# Patient Record
Sex: Male | Born: 1960 | Race: Black or African American | Hispanic: No | Marital: Single | Smoking: Former smoker
Health system: Southern US, Community
[De-identification: ages and names within clinical notes are randomized; demographics above are authoritative.]

## PROBLEM LIST (undated history)

## (undated) DIAGNOSIS — I10 Essential (primary) hypertension: Secondary | ICD-10-CM

## (undated) HISTORY — PX: KIDNEY STONE SURGERY: SHX686

## (undated) HISTORY — DX: Essential (primary) hypertension: I10

---

## 2005-05-14 ENCOUNTER — Emergency Department: Payer: Self-pay | Admitting: Emergency Medicine

## 2005-10-01 ENCOUNTER — Emergency Department: Payer: Self-pay | Admitting: Emergency Medicine

## 2005-10-06 ENCOUNTER — Emergency Department: Payer: Self-pay | Admitting: Emergency Medicine

## 2005-11-06 ENCOUNTER — Ambulatory Visit: Payer: Self-pay | Admitting: Urology

## 2005-11-08 ENCOUNTER — Emergency Department: Payer: Self-pay | Admitting: Emergency Medicine

## 2005-11-16 ENCOUNTER — Ambulatory Visit: Payer: Self-pay | Admitting: Urology

## 2005-11-20 ENCOUNTER — Emergency Department: Payer: Self-pay | Admitting: Emergency Medicine

## 2007-04-07 ENCOUNTER — Emergency Department (HOSPITAL_COMMUNITY): Admission: EM | Admit: 2007-04-07 | Discharge: 2007-04-07 | Payer: Self-pay | Admitting: Emergency Medicine

## 2010-04-02 ENCOUNTER — Emergency Department: Payer: Self-pay | Admitting: Emergency Medicine

## 2014-03-25 DIAGNOSIS — I10 Essential (primary) hypertension: Secondary | ICD-10-CM | POA: Insufficient documentation

## 2015-05-14 ENCOUNTER — Ambulatory Visit: Payer: Self-pay | Admitting: Family Medicine

## 2015-06-11 ENCOUNTER — Ambulatory Visit (INDEPENDENT_AMBULATORY_CARE_PROVIDER_SITE_OTHER): Payer: No Typology Code available for payment source | Admitting: Family Medicine

## 2015-06-11 ENCOUNTER — Encounter: Payer: Self-pay | Admitting: Family Medicine

## 2015-06-11 VITALS — BP 180/120 | HR 83 | Temp 98.1°F | Resp 16 | Ht 69.0 in | Wt 183.4 lb

## 2015-06-11 DIAGNOSIS — I10 Essential (primary) hypertension: Secondary | ICD-10-CM

## 2015-06-11 DIAGNOSIS — N200 Calculus of kidney: Secondary | ICD-10-CM

## 2015-06-11 DIAGNOSIS — I119 Hypertensive heart disease without heart failure: Secondary | ICD-10-CM

## 2015-06-11 DIAGNOSIS — Z0000 Encounter for general adult medical examination without abnormal findings: Secondary | ICD-10-CM

## 2015-06-11 DIAGNOSIS — M62838 Other muscle spasm: Secondary | ICD-10-CM | POA: Insufficient documentation

## 2015-06-11 MED ORDER — LISINOPRIL-HYDROCHLOROTHIAZIDE 10-12.5 MG PO TABS: 1.0000 | ORAL_TABLET | Freq: Every day | ORAL | Status: DC

## 2015-06-11 NOTE — Progress Notes (Signed)
Patient ID: Oscar Clark, male   DOB: 03/19/1961, 54 y.o.   MRN: [id number]       Patient: Oscar Clark, Male    DOB: 03/15/1961, 54 y.o.   MRN: [id number] Visit Date: 06/11/2015  Today's Provider: Dortha Kernennis Chrismon, PA   Chief Complaint  Patient presents with  . Establish Care   Subjective:    Annual physical exam Oscar Clark [name] is a 54 y.o. male who presents today for health maintenance and complete physical. He feels fairly well. He reports exercising by walking 20-30 minutes 3 times a week). 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 Smokes 1 pack of cigarettes/week, gets 6 beers on the weekends, 2 soft drinks/day and occasionally an energy drink. Admits he has not taken the Lisinopril/HCTZ 10/12.5 mg qd for the past week. Was started on this medication a year ago by Dr. Juliann Paresallwood.  ----------------------------------------------------------------- Review of Systems  Constitutional: Negative.   HENT: Negative.   Eyes: Negative.   Respiratory: Negative.   Cardiovascular: Negative.   Gastrointestinal: Negative.   Endocrine: Negative.   Genitourinary: Negative.   Musculoskeletal: Positive for myalgias.       Back ache associated with work.  Skin: Negative.   Allergic/Immunologic: Negative.   Neurological: Negative.   Hematological: Negative.   Psychiatric/Behavioral: Negative.    Social History He  reports that he has been smoking.  He has never used smokeless tobacco. He reports that he drinks alcohol. He reports that he does not use illicit drugs. Social History   Social History  . Marital Status: Single    Spouse Name: N/A  . Number of Children: N/A  . Years of Education: N/A   Social History Main Topics  . Smoking status: Current Some Day Smoker  . Smokeless tobacco: Never Used  . Alcohol Use: 0.0 oz/week    0 Standard  drinks or equivalent per week     Comment: OCCASIONALLY  . Drug Use: No  . Sexual Activity: Not Asked   Other Topics Concern  . None   Social History Narrative  . None    Patient Active Problem List   Diagnosis Date Noted  . Kidney stone 06/11/2015    Class: History of  . Muscle spasm 06/11/2015  . BP (high blood pressure) 03/25/2014   Past Surgical History  Procedure Laterality Date  . Kidney stone surgery     Family History  Family Status  Relation Status Death Age  . Mother Alive   . Father Deceased 6460'S  . Brother Alive    No Known Allergies  Current Outpatient Prescriptions  Medication Sig Dispense Refill  . lisinopril-hydrochlorothiazide (PRINZIDE,ZESTORETIC) 10-12.5 MG tablet Take 1 tablet by mouth daily. 30 tablet 3  . amLODipine (NORVASC) 5 MG tablet Take 5 mg by mouth daily.    Marland Kitchen. labetalol (NORMODYNE) 100 MG tablet Take 100 mg by mouth 2 (two) times daily.     No current facility-administered medications for this visit.   Patient Care Team: Tamsen Roersennis E Chrismon, PA as PCP - General (Family Medicine)     Objective:   Vitals: BP 180/120 mmHg  Pulse 83  Temp(Src) 98.1 F (36.7 C) (Oral)  Resp 16  Ht 5\' 9"  (1.753 m)  Wt 183 lb 6.4 oz (83.19 kg)  BMI 27.07 kg/m2  SpO2 97%   Physical Exam  Constitutional:  He is oriented to person, place, and time. He appears well-developed and well-nourished.  HENT:  Head: Normocephalic and atraumatic.  Right Ear: External ear normal.  Left Ear: External ear normal.  Nose: Nose normal.  Mouth/Throat: Oropharynx is clear and moist.  Eyes: EOM are normal. Pupils are equal, round, and reactive to light.  Neck: Normal range of motion. Neck supple. No JVD present. No thyromegaly present.  Cardiovascular: Normal rate, regular rhythm, normal heart sounds and intact distal pulses.   Pulmonary/Chest: Effort normal and breath sounds normal.  Abdominal: Soft. Bowel sounds are normal.  Genitourinary: Rectum normal, prostate  normal and penis normal. Guaiac negative stool.  Musculoskeletal: Normal range of motion. He exhibits no edema.  Lymphadenopathy:    He has no cervical adenopathy.  Neurological: He is alert and oriented to person, place, and time. He has normal reflexes.  Skin: Skin is warm and dry.  Psychiatric: He has a normal mood and affect. His behavior is normal. Thought content normal.   Depression Screen Denies suicidal ideation. No significant anxiety.   Assessment & Plan:     Routine Health Maintenance and Physical Exam  Exercise Activities and Dietary recommendations Goals    Continue present walking regimen.      Discussed health benefits of physical activity, and encouraged him to engage in regular exercise appropriate for his age and condition.    -------------------------------------------------------------------- 1. Annual physical exam General health stable. BP very high today. Essentially asymptomatic today. Has not had a colonoscopy and unsure when he had his last tetanus booster. Hemoccult test negative for occult blood. Consider Tdap and scheduling colonoscopy as soon as BP controlled again. - POC Hemoccult Bld/Stl (1-Cd Office Dx)  2. Essential hypertension Very poor compliance with medications. Pharmacy states these medications have not been refilled since getting first prescription 03-25-14. States he has some of the Amlodipine and Labetalol at home. Will refill Lisinopril-HCTZ and restart these medications to take DAILY. Will get recheck of labs. EKG shows some LVH. Recheck appointment in 2 weeks (sooner pending lab reports). - EKG 12-Lead - CBC with Differential/Platelet - COMPLETE METABOLIC PANEL WITH GFR - Lipid panel - TSH - lisinopril-hydrochlorothiazide (PRINZIDE,ZESTORETIC) 10-12.5 MG tablet; Take 1 tablet by mouth daily.  Dispense: 30 tablet; Refill: 3 - amLODipine (NORVASC) 5 MG tablet; Take 5 mg by mouth daily. - labetalol (NORMODYNE) 100 MG tablet; Take 100 mg  by mouth 2 (two) times daily.  3. Kidney stone No recent pain or hematuria. Continue to drink fluids and limit tea or calcium supplements.  4. LVH (left ventricular hypertrophy) due to hypertensive disease, without heart failure EKG confirms possible left ventricular hypertrophy and some possible anterolateral ischemia. Need to schedule recheck with cardiologist (Dr. Juliann Pares). May be due for follow up of echocardiogram.

## 2015-06-12 LAB — LIPID PANEL
Chol/HDL Ratio: 5.1 ratio units — ABNORMAL HIGH (ref 0.0–5.0)
Cholesterol, Total: 202 mg/dL — ABNORMAL HIGH (ref 100–199)
HDL: 40 mg/dL (ref >39)
LDL Calculated: 121 mg/dL — ABNORMAL HIGH (ref 0–99)
Triglycerides: 206 mg/dL — ABNORMAL HIGH (ref 0–149)
VLDL Cholesterol Cal: 41 mg/dL — ABNORMAL HIGH (ref 5–40)

## 2015-06-12 LAB — CBC WITH DIFFERENTIAL/PLATELET
Basophils Absolute: 0 10*3/uL (ref 0.0–0.2)
Basos: 1 %
EOS (ABSOLUTE): 0.2 10*3/uL (ref 0.0–0.4)
EOS: 3 %
HEMATOCRIT: 45.3 % (ref 37.5–51.0)
Hemoglobin: 14.9 g/dL (ref 12.6–17.7)
Immature Grans (Abs): 0 10*3/uL (ref 0.0–0.1)
Immature Granulocytes: 0 %
LYMPHS ABS: 2.1 10*3/uL (ref 0.7–3.1)
Lymphs: 32 %
MCH: 27.3 pg (ref 26.6–33.0)
MCHC: 32.9 g/dL (ref 31.5–35.7)
MCV: 83 fL (ref 79–97)
MONOS ABS: 0.4 10*3/uL (ref 0.1–0.9)
Monocytes: 7 %
NEUTROS ABS: 3.8 10*3/uL (ref 1.4–7.0)
Neutrophils: 57 %
Platelets: 300 10*3/uL (ref 150–379)
RBC: 5.45 x10E6/uL (ref 4.14–5.80)
RDW: 13.6 % (ref 12.3–15.4)
WBC: 6.5 10*3/uL (ref 3.4–10.8)

## 2015-06-12 LAB — COMPREHENSIVE METABOLIC PANEL
A/G RATIO: 1.7 (ref 1.1–2.5)
ALK PHOS: 81 IU/L (ref 39–117)
ALT: 21 IU/L (ref 0–44)
AST: 21 IU/L (ref 0–40)
Albumin: 4.5 g/dL (ref 3.5–5.5)
BUN/Creatinine Ratio: 19 (ref 9–20)
BUN: 23 mg/dL (ref 6–24)
Bilirubin Total: 0.3 mg/dL (ref 0.0–1.2)
CALCIUM: 10.1 mg/dL (ref 8.7–10.2)
CO2: 22 mmol/L (ref 18–29)
CREATININE: 1.22 mg/dL (ref 0.76–1.27)
Chloride: 102 mmol/L (ref 97–108)
GFR calc Af Amer: 77 mL/min/{1.73_m2} (ref >59)
GFR, EST NON AFRICAN AMERICAN: 66 mL/min/{1.73_m2} (ref >59)
GLOBULIN, TOTAL: 2.7 g/dL (ref 1.5–4.5)
Glucose: 105 mg/dL — ABNORMAL HIGH (ref 65–99)
POTASSIUM: 4.3 mmol/L (ref 3.5–5.2)
SODIUM: 143 mmol/L (ref 134–144)
Total Protein: 7.2 g/dL (ref 6.0–8.5)

## 2015-06-12 LAB — TSH: TSH: 1.15 u[IU]/mL (ref 0.450–4.500)

## 2015-06-14 ENCOUNTER — Telehealth: Payer: Self-pay

## 2015-06-14 NOTE — Telephone Encounter (Signed)
-----   Message from Tamsen Roersennis E Chrismon, [place] sent at 06/14/2015  8:19 AM EDT ----- All blood tests essentially normal except cholesterol and triglycerides high. Need Pravachol 20 mg qd #30 & 3 RF. Be sure to take Lisinopril-HCTZ 10-12.5 mg qd, Amlodipine 5 mg qd, Labetalol 100 mg BID, proceed with cardiology referral and recheck BP in 2 weeks.

## 2015-06-14 NOTE — Telephone Encounter (Signed)
LMTCB

## 2015-06-17 NOTE — Telephone Encounter (Signed)
LMTCB

## 2015-06-22 NOTE — Telephone Encounter (Signed)
Unable to reach patient by phone. Letter mailed to address in chart.

## 2016-08-01 ENCOUNTER — Encounter: Payer: Self-pay | Admitting: Family Medicine

## 2016-08-01 ENCOUNTER — Ambulatory Visit (INDEPENDENT_AMBULATORY_CARE_PROVIDER_SITE_OTHER): Payer: Managed Care, Other (non HMO) | Admitting: Family Medicine

## 2016-08-01 VITALS — BP 174/110 | HR 98 | Temp 98.9°F | Resp 16 | Wt 174.6 lb

## 2016-08-01 DIAGNOSIS — M545 Low back pain, unspecified: Secondary | ICD-10-CM

## 2016-08-01 LAB — POCT URINALYSIS DIPSTICK
Bilirubin, UA: NEGATIVE
Glucose, UA: NEGATIVE
KETONES UA: NEGATIVE
Leukocytes, UA: NEGATIVE
Nitrite, UA: NEGATIVE
PH UA: 5
RBC UA: NEGATIVE
UROBILINOGEN UA: 0.2

## 2016-08-01 MED ORDER — CYCLOBENZAPRINE HCL 5 MG PO TABS: 5.0000 mg | ORAL_TABLET | Freq: Three times a day (TID) | ORAL | 1 refills | Status: DC | PRN

## 2016-08-01 NOTE — Progress Notes (Addendum)
Patient: Oscar Clark Male    DOB: 10/27/1960   55 y.o.   MRN: [id number] Visit Date: 08/01/2016  Today's Provider: Dortha Kernennis Chrismon, PA   Chief Complaint  Patient presents with  . Back Pain   Subjective:    Back Pain  This is a new problem. The current episode started in the past 7 days. The problem occurs constantly. The pain is present in the lumbar spine. The quality of the pain is described as aching. (Possible hematuria ) Risk factors: history of kidney stones. He has tried NSAIDs for the symptoms. The treatment provided no relief.   Past Medical History:  Diagnosis Date  . Hypertension    Patient Active Problem List   Diagnosis Date Noted  . Kidney stone 06/11/2015    Class: History of  . Muscle spasm 06/11/2015  . BP (high blood pressure) 03/25/2014   Past Surgical History:  Procedure Laterality Date  . KIDNEY STONE SURGERY     History reviewed. No pertinent family history.   No Known Allergies    Previous Medications   AMLODIPINE (NORVASC) 5 MG TABLET    Take 5 mg by mouth daily.   LABETALOL (NORMODYNE) 100 MG TABLET    Take 100 mg by mouth 2 (two) times daily.   LISINOPRIL-HYDROCHLOROTHIAZIDE (PRINZIDE,ZESTORETIC) 10-12.5 MG TABLET    Take 1 tablet by mouth daily.    Review of Systems  Constitutional: Negative.   Respiratory: Negative.   Cardiovascular: Negative.   Genitourinary: Positive for hematuria.  Musculoskeletal: Positive for back pain.    Social History  Substance Use Topics  . Smoking status: Current Some Day Smoker  . Smokeless tobacco: Never Used  . Alcohol use 0.0 oz/week     Comment: OCCASIONALLY   Objective:   BP (!) 174/110 (BP Location: Right Arm, Patient Position: Sitting, Cuff Size: Normal)   Pulse 98   Temp 98.9 F (37.2 C) (Oral)   Resp 16   Wt 174 lb 9.6 oz (79.2 kg)   BMI 25.78 kg/m   Physical Exam  Constitutional: He is oriented to person, place, and time. He appears well-developed and well-nourished. No distress.    HENT:  Head: Normocephalic and atraumatic.  Right Ear: Hearing normal.  Left Ear: Hearing normal.  Nose: Nose normal.  Eyes: Conjunctivae and lids are normal. Right eye exhibits no discharge. Left eye exhibits no discharge. No scleral icterus.  Pulmonary/Chest: Effort normal. No respiratory distress.  Musculoskeletal:  Pain in lower back across to sacrum bilaterally. SLR's 90 degrees without radiating pain. Increased pain to bend over. No numbness or weakness.  Neurological: He is alert and oriented to person, place, and time.  Skin: Skin is intact. No lesion and no rash noted.  Psychiatric: He has a normal mood and affect. His speech is normal and behavior is normal. Thought content normal.      Assessment & Plan:     1. Acute left-sided low back pain without sciatica Onset the past 4-5 days. No specific injury identified. Does a lot of repetitive lifting at work of 4-5 lb parts. Recommend moist heat, use Ibuprofen 200 mg 4 tablets TID with food and add Flexeril. Given rehab back exercises and may return to work in 2 days. Recheck if no better in 5 days. May use Aspercreme with Lidocaine rubbed into the lower back. - POCT urinalysis dipstick - cyclobenzaprine (FLEXERIL) 5 MG tablet; Take 1 tablet (5 mg total) by mouth 3 (three) times daily as needed for muscle spasms.  Dispense: 30 tablet; Refill: 1

## 2016-08-01 NOTE — Patient Instructions (Signed)
Back Exercises Introduction If you have pain in your back, do these exercises 2-3 times each day or as told by your doctor. When the pain goes away, do the exercises once each day, but repeat the steps more times for each exercise (do more repetitions). If you do not have pain in your back, do these exercises once each day or as told by your doctor. Exercises Single Knee to Chest  Do these steps 3-5 times in a row for each leg: 1. Lie on your back on a firm bed or the floor with your legs stretched out. 2. Bring one knee to your chest. 3. Hold your knee to your chest by grabbing your knee or thigh. 4. Pull on your knee until you feel a gentle stretch in your lower back. 5. Keep doing the stretch for 10-30 seconds. 6. Slowly let go of your leg and straighten it. Pelvic Tilt  Do these steps 5-10 times in a row: 1. Lie on your back on a firm bed or the floor with your legs stretched out. 2. Bend your knees so they point up to the ceiling. Your feet should be flat on the floor. 3. Tighten your lower belly (abdomen) muscles to press your lower back against the floor. This will make your tailbone point up to the ceiling instead of pointing down to your feet or the floor. 4. Stay in this position for 5-10 seconds while you gently tighten your muscles and breathe evenly. Cat-Cow  Do these steps until your lower back bends more easily: 1. Get on your hands and knees on a firm surface. Keep your hands under your shoulders, and keep your knees under your hips. You may put padding under your knees. 2. Let your head hang down, and make your tailbone point down to the floor so your lower back is round like the back of a cat. 3. Stay in this position for 5 seconds. 4. Slowly lift your head and make your tailbone point up to the ceiling so your back hangs low (sags) like the back of a cow. 5. Stay in this position for 5 seconds. Press-Ups  Do these steps 5-10 times in a row: 1. Lie on your belly  (face-down) on the floor. 2. Place your hands near your head, about shoulder-width apart. 3. While you keep your back relaxed and keep your hips on the floor, slowly straighten your arms to raise the top half of your body and lift your shoulders. Do not use your back muscles. To make yourself more comfortable, you may change where you place your hands. 4. Stay in this position for 5 seconds. 5. Slowly return to lying flat on the floor. Bridges  Do these steps 10 times in a row: 1. Lie on your back on a firm surface. 2. Bend your knees so they point up to the ceiling. Your feet should be flat on the floor. 3. Tighten your butt muscles and lift your butt off of the floor until your waist is almost as high as your knees. If you do not feel the muscles working in your butt and the back of your thighs, slide your feet 1-2 inches farther away from your butt. 4. Stay in this position for 3-5 seconds. 5. Slowly lower your butt to the floor, and let your butt muscles relax. If this exercise is too easy, try doing it with your arms crossed over your chest. Belly Crunches  Do these steps 5-10 times in a row: 1. Lie   on your back on a firm bed or the floor with your legs stretched out. 2. Bend your knees so they point up to the ceiling. Your feet should be flat on the floor. 3. Cross your arms over your chest. 4. Tip your chin a little bit toward your chest but do not bend your neck. 5. Tighten your belly muscles and slowly raise your chest just enough to lift your shoulder blades a tiny bit off of the floor. 6. Slowly lower your chest and your head to the floor. Back Lifts  Do these steps 5-10 times in a row: 1. Lie on your belly (face-down) with your arms at your sides, and rest your forehead on the floor. 2. Tighten the muscles in your legs and your butt. 3. Slowly lift your chest off of the floor while you keep your hips on the floor. Keep the back of your head in line with the curve in your back.  Look at the floor while you do this. 4. Stay in this position for 3-5 seconds. 5. Slowly lower your chest and your face to the floor. Contact a doctor if:  Your back pain gets a lot worse when you do an exercise.  Your back pain does not lessen 2 hours after you exercise. If you have any of these problems, stop doing the exercises. Do not do them again unless your doctor says it is okay. Get help right away if:  You have sudden, very bad back pain. If this happens, stop doing the exercises. Do not do them again unless your doctor says it is okay. This information is not intended to replace advice given to you by your health care provider. Make sure you discuss any questions you have with your health care provider. Document Released: 09/16/2010 Document Revised: 01/20/2016 Document Reviewed: 10/08/2014  2017 Elsevier  

## 2016-12-06 ENCOUNTER — Other Ambulatory Visit: Payer: Self-pay | Admitting: Family Medicine

## 2016-12-06 DIAGNOSIS — I10 Essential (primary) hypertension: Secondary | ICD-10-CM

## 2019-03-16 ENCOUNTER — Observation Stay
Admit: 2019-03-16 | Discharge: 2019-03-16 | Disposition: A | Discharge status: Home / Self Care | Payer: Self-pay | Attending: Family Medicine | Admitting: Family Medicine

## 2019-03-16 ENCOUNTER — Emergency Department: Payer: Self-pay

## 2019-03-16 ENCOUNTER — Observation Stay
Admission: EM | Admit: 2019-03-16 | Discharge: 2019-03-16 | Disposition: A | Discharge status: Home / Self Care | Payer: Self-pay | Attending: Internal Medicine | Admitting: Internal Medicine

## 2019-03-16 ENCOUNTER — Observation Stay: Payer: Self-pay

## 2019-03-16 ENCOUNTER — Other Ambulatory Visit: Payer: Self-pay

## 2019-03-16 DIAGNOSIS — R778 Other specified abnormalities of plasma proteins: Secondary | ICD-10-CM

## 2019-03-16 DIAGNOSIS — Z1159 Encounter for screening for other viral diseases: Secondary | ICD-10-CM | POA: Insufficient documentation

## 2019-03-16 DIAGNOSIS — E876 Hypokalemia: Secondary | ICD-10-CM | POA: Insufficient documentation

## 2019-03-16 DIAGNOSIS — R079 Chest pain, unspecified: Secondary | ICD-10-CM

## 2019-03-16 DIAGNOSIS — I471 Supraventricular tachycardia, unspecified: Secondary | ICD-10-CM | POA: Diagnosis present

## 2019-03-16 DIAGNOSIS — R7989 Other specified abnormal findings of blood chemistry: Secondary | ICD-10-CM | POA: Insufficient documentation

## 2019-03-16 DIAGNOSIS — F17200 Nicotine dependence, unspecified, uncomplicated: Secondary | ICD-10-CM | POA: Insufficient documentation

## 2019-03-16 DIAGNOSIS — Z8249 Family history of ischemic heart disease and other diseases of the circulatory system: Secondary | ICD-10-CM | POA: Insufficient documentation

## 2019-03-16 DIAGNOSIS — I10 Essential (primary) hypertension: Secondary | ICD-10-CM | POA: Insufficient documentation

## 2019-03-16 DIAGNOSIS — Z79899 Other long term (current) drug therapy: Secondary | ICD-10-CM | POA: Insufficient documentation

## 2019-03-16 DIAGNOSIS — R0789 Other chest pain: Secondary | ICD-10-CM | POA: Insufficient documentation

## 2019-03-16 DIAGNOSIS — Z87442 Personal history of urinary calculi: Secondary | ICD-10-CM | POA: Insufficient documentation

## 2019-03-16 LAB — LIPID PANEL
Cholesterol: 166 mg/dL (ref 0–200)
HDL: 43 mg/dL (ref >40)
LDL Cholesterol: 112 mg/dL — ABNORMAL HIGH (ref 0–99)
Total CHOL/HDL Ratio: 3.9 RATIO
Triglycerides: 53 mg/dL (ref <150)
VLDL: 11 mg/dL (ref 0–40)

## 2019-03-16 LAB — CBC WITH DIFFERENTIAL/PLATELET
Abs Immature Granulocytes: 0.01 10*3/uL (ref 0.00–0.07)
Basophils Absolute: 0 10*3/uL (ref 0.0–0.1)
Basophils Relative: 1 %
Eosinophils Absolute: 0.1 10*3/uL (ref 0.0–0.5)
Eosinophils Relative: 2 %
HCT: 48.8 % (ref 39.0–52.0)
Hemoglobin: 15.6 g/dL (ref 13.0–17.0)
Immature Granulocytes: 0 %
Lymphocytes Relative: 23 %
Lymphs Abs: 1.3 10*3/uL (ref 0.7–4.0)
MCH: 27.7 pg (ref 26.0–34.0)
MCHC: 32 g/dL (ref 30.0–36.0)
MCV: 86.7 fL (ref 80.0–100.0)
Monocytes Absolute: 0.3 10*3/uL (ref 0.1–1.0)
Monocytes Relative: 5 %
Neutro Abs: 3.8 10*3/uL (ref 1.7–7.7)
Neutrophils Relative %: 69 %
Platelets: 266 10*3/uL (ref 150–400)
RBC: 5.63 MIL/uL (ref 4.22–5.81)
RDW: 13.6 % (ref 11.5–15.5)
WBC: 5.5 10*3/uL (ref 4.0–10.5)
nRBC: 0 % (ref 0.0–0.2)

## 2019-03-16 LAB — TROPONIN I (HIGH SENSITIVITY)
Troponin I (High Sensitivity): 50 ng/L — ABNORMAL HIGH (ref <18)
Troponin I (High Sensitivity): 360 ng/L (ref <18)

## 2019-03-16 LAB — PROTIME-INR
INR: 1 (ref 0.8–1.2)
Prothrombin Time: 12.7 seconds (ref 11.4–15.2)

## 2019-03-16 LAB — ECHOCARDIOGRAM COMPLETE
Height: 69 in
Weight: 2848 oz

## 2019-03-16 LAB — COMPREHENSIVE METABOLIC PANEL
ALT: 48 U/L — ABNORMAL HIGH (ref 0–44)
AST: 51 U/L — ABNORMAL HIGH (ref 15–41)
Albumin: 4 g/dL (ref 3.5–5.0)
Alkaline Phosphatase: 80 U/L (ref 38–126)
Anion gap: 11 (ref 5–15)
BUN: 25 mg/dL — ABNORMAL HIGH (ref 6–20)
CO2: 17 mmol/L — ABNORMAL LOW (ref 22–32)
Calcium: 8.7 mg/dL — ABNORMAL LOW (ref 8.9–10.3)
Chloride: 109 mmol/L (ref 98–111)
Creatinine, Ser: 1.01 mg/dL (ref 0.61–1.24)
GFR calc Af Amer: >60 mL/min (ref >60)
GFR calc non Af Amer: >60 mL/min (ref >60)
Glucose, Bld: 144 mg/dL — ABNORMAL HIGH (ref 70–99)
Potassium: 3.4 mmol/L — ABNORMAL LOW (ref 3.5–5.1)
Sodium: 137 mmol/L (ref 135–145)
Total Bilirubin: 0.5 mg/dL (ref 0.3–1.2)
Total Protein: 7.2 g/dL (ref 6.5–8.1)

## 2019-03-16 LAB — MAGNESIUM: Magnesium: 2.2 mg/dL (ref 1.7–2.4)

## 2019-03-16 LAB — APTT: aPTT: 31 seconds (ref 24–36)

## 2019-03-16 LAB — SARS CORONAVIRUS 2 BY RT PCR (HOSPITAL ORDER, PERFORMED IN ~~LOC~~ HOSPITAL LAB): SARS Coronavirus 2: NEGATIVE

## 2019-03-16 MED ORDER — ASPIRIN 81 MG PO TBEC: 81.0000 mg | DELAYED_RELEASE_TABLET | Freq: Every day | ORAL | 0 refills | Status: DC

## 2019-03-16 MED ORDER — LABETALOL HCL 100 MG PO TABS
100.0000 mg | ORAL_TABLET | Freq: Two times a day (BID) | ORAL | Status: DC
  Administered 2019-03-16: 100 mg via ORAL
  Filled 2019-03-16 (×2): qty 1

## 2019-03-16 MED ORDER — METOPROLOL TARTRATE 25 MG PO TABS: 12.5000 mg | ORAL_TABLET | Freq: Two times a day (BID) | ORAL | Status: DC

## 2019-03-16 MED ORDER — ASPIRIN EC 81 MG PO TBEC
81.0000 mg | DELAYED_RELEASE_TABLET | Freq: Every day | ORAL | Status: DC
  Administered 2019-03-16: 81 mg via ORAL
  Filled 2019-03-16: qty 1

## 2019-03-16 MED ORDER — LISINOPRIL-HYDROCHLOROTHIAZIDE 10-12.5 MG PO TABS: 1.0000 | ORAL_TABLET | Freq: Every day | ORAL | Status: DC

## 2019-03-16 MED ORDER — POTASSIUM CHLORIDE 20 MEQ PO PACK
40.0000 meq | PACK | Freq: Once | ORAL | Status: AC
  Administered 2019-03-16: 40 meq via ORAL
  Filled 2019-03-16: qty 2

## 2019-03-16 MED ORDER — LABETALOL HCL 100 MG PO TABS
100.0000 mg | ORAL_TABLET | Freq: Two times a day (BID) | ORAL | Status: DC
  Filled 2019-03-16 (×2): qty 1

## 2019-03-16 MED ORDER — LIDOCAINE VISCOUS HCL 2 % MT SOLN
15.0000 mL | Freq: Once | OROMUCOSAL | Status: AC
  Administered 2019-03-16: 15 mL via ORAL
  Filled 2019-03-16: qty 15

## 2019-03-16 MED ORDER — HEPARIN SODIUM (PORCINE) 5000 UNIT/ML IJ SOLN
4000.0000 [IU] | Freq: Once | INTRAMUSCULAR | Status: AC
  Administered 2019-03-16: 4000 [IU] via INTRAVENOUS

## 2019-03-16 MED ORDER — AMLODIPINE BESYLATE 5 MG PO TABS
5.0000 mg | ORAL_TABLET | Freq: Every day | ORAL | Status: DC
  Administered 2019-03-16: 5 mg via ORAL
  Filled 2019-03-16: qty 1

## 2019-03-16 MED ORDER — LABETALOL HCL 200 MG PO TABS
200.0000 mg | ORAL_TABLET | Freq: Two times a day (BID) | ORAL | Status: DC
  Filled 2019-03-16: qty 1

## 2019-03-16 MED ORDER — IOPAMIDOL (ISOVUE-370) INJECTION 76%
100.0000 mL | Freq: Once | INTRAVENOUS | Status: AC | PRN
  Administered 2019-03-16: 100 mL via INTRAVENOUS

## 2019-03-16 MED ORDER — ACETAMINOPHEN 325 MG PO TABS: 650.0000 mg | ORAL_TABLET | ORAL | Status: DC | PRN

## 2019-03-16 MED ORDER — ZOLPIDEM TARTRATE 5 MG PO TABS: 5.0000 mg | ORAL_TABLET | Freq: Every evening | ORAL | Status: DC | PRN

## 2019-03-16 MED ORDER — LISINOPRIL 10 MG PO TABS
10.0000 mg | ORAL_TABLET | Freq: Every day | ORAL | Status: DC
  Administered 2019-03-16: 10 mg via ORAL
  Filled 2019-03-16: qty 1

## 2019-03-16 MED ORDER — HEPARIN (PORCINE) 25000 UT/250ML-% IV SOLN
1150.0000 [IU]/h | INTRAVENOUS | Status: DC
  Administered 2019-03-16 (×2): 1150 [IU]/h via INTRAVENOUS
  Filled 2019-03-16: qty 250

## 2019-03-16 MED ORDER — HYDROCHLOROTHIAZIDE 12.5 MG PO CAPS
12.5000 mg | ORAL_CAPSULE | Freq: Every day | ORAL | Status: DC
  Administered 2019-03-16: 12.5 mg via ORAL
  Filled 2019-03-16: qty 1

## 2019-03-16 MED ORDER — ALUM & MAG HYDROXIDE-SIMETH 200-200-20 MG/5ML PO SUSP
30.0000 mL | Freq: Once | ORAL | Status: AC
  Administered 2019-03-16: 30 mL via ORAL
  Filled 2019-03-16: qty 30

## 2019-03-16 MED ORDER — METOPROLOL TARTRATE 25 MG PO TABS
12.5000 mg | ORAL_TABLET | Freq: Once | ORAL | Status: AC
  Administered 2019-03-16: 12.5 mg via ORAL
  Filled 2019-03-16: qty 1

## 2019-03-16 MED ORDER — METOPROLOL TARTRATE 25 MG PO TABS: 25.0000 mg | ORAL_TABLET | Freq: Two times a day (BID) | ORAL | Status: DC

## 2019-03-16 MED ORDER — CYCLOBENZAPRINE HCL 10 MG PO TABS: 5.0000 mg | ORAL_TABLET | Freq: Three times a day (TID) | ORAL | Status: DC | PRN

## 2019-03-16 MED ORDER — ONDANSETRON HCL 4 MG/2ML IJ SOLN: 4.0000 mg | Freq: Four times a day (QID) | INTRAMUSCULAR | Status: DC | PRN

## 2019-03-16 NOTE — H&P (Addendum)
[facility] [place] [facility]   PATIENT NAME: Oscar Clark    MR#:  [id number]  DATE OF BIRTH:  10/28/1960  DATE OF ADMISSION:  03/16/2019  PRIMARY CARE PHYSICIAN: Chrismon, Jodell Ciproennis E, PA   REQUESTING/REFERRING PHYSICIAN: Chiquita LothSung, Jade, MD CHIEF COMPLAINT:   Chief Complaint  Patient presents with   Chest Pain    HISTORY OF PRESENT ILLNESS:  Oscar HoraBarry Urbanik  is a 58 y.o. male with a known history of hypertension and nephrolithiasis, who presented to the emergency room with a consider right-sided chest pain with associated palpitations that woke him up from sleep.  He was noted to be in SVT with a rate of 190 by EMS.  He was given 6 mg of IV adenosine followed by 12 mg and converted to sinus rhythm.  He admitted to having nausea without vomiting or diaphoresis with his chest pain.  He also experienced brief right hand weakness without numbness with his chest pain.  He denied any cough or wheezing or hemoptysis.  No leg pain or edema or recent travels or surgeries.  No paresthesias or focal muscle weakness or headache or dizziness or blurred vision.  No bleeding diathesis.  No recent sick exposures  Upon presentation to the emergency room, blood pressure was 156/95 with a heart rate of 100 and otherwise normal vital signs.  Portable chest ray showed mild interstitial coarsening probably involving lower lung field that may be chronic with atypical infection not excluded.  COVID-19 test came back negative.  EKG showed normal sinus rhythm with rate of 88 with LVH by voltage criteria.  The patient's high-sensitivity troponin I initial level was 50 and 2 hours later was up to 360.  He was given IV heparin bolus and drip.  He will be admitted to an observation telemetry bed for further evaluation and management. PAST MEDICAL HISTORY:   Past Medical History:  Diagnosis Date   Hypertension   Kidney stones Tobacco abuse PAST SURGICAL HISTORY:   Past Surgical History:    Procedure Laterality Date   KIDNEY STONE SURGERY      SOCIAL HISTORY:   Social History   Tobacco Use   Smoking status: Current Some Day Smoker   Smokeless tobacco: Never Used  Substance Use Topics   Alcohol use: Yes    Alcohol/week: 0.0 standard drinks    Comment: OCCASIONALLY    FAMILY HISTORY:  Positive for hypertension in his mother  DRUG ALLERGIES:  No Known Allergies  REVIEW OF SYSTEMS:   ROS As per history of present illness. All pertinent systems were reviewed above. Constitutional,  HEENT, cardiovascular, respiratory, GI, GU, musculoskeletal, neuro, psychiatric, endocrine,  integumentary and hematologic systems were reviewed and are otherwise  negative/unremarkable except for positive findings mentioned above in the HPI.   MEDICATIONS AT HOME:   Prior to Admission medications   Medication Sig Start Date End Date Taking? Authorizing Provider  amLODipine (NORVASC) 5 MG tablet Take 5 mg by mouth daily.    [provider]  cyclobenzaprine (FLEXERIL) 5 MG tablet Take 1 tablet (5 mg total) by mouth 3 (three) times daily as needed for muscle spasms. 08/01/16   Chrismon, Jodell Ciproennis E, PA  labetalol (NORMODYNE) 100 MG tablet Take 100 mg by mouth 2 (two) times daily.    [provider]  lisinopril-hydrochlorothiazide (PRINZIDE,ZESTORETIC) 10-12.5 MG tablet TAKE ONE TABLET BY MOUTH ONCE DAILY 12/07/16   Chrismon, Jodell Ciproennis E, PA      VITAL SIGNS:  Blood pressure (!) 156/95,  pulse 100, temperature 98 F (36.7 C), temperature source Oral, resp. rate 18, height 5\' 9"  (1.753 m), weight 80.7 kg, SpO2 98 %.  PHYSICAL EXAMINATION:  Physical Exam  GENERAL:  58 y.o.-year-old Caucasian male patient lying in the bed with no acute distress.  EYES: Pupils equal, round, reactive to light and accommodation. No scleral icterus. Extraocular muscles intact.  HEENT: Head atraumatic, normocephalic. Oropharynx and nasopharynx clear.  NECK:  Supple, no jugular venous  distention. No thyroid enlargement, no tenderness.  LUNGS: Normal breath sounds bilaterally, no wheezing, rales,rhonchi or crepitation. No use of accessory muscles of respiration.  CARDIOVASCULAR: Regular rate and rhythm, S1, S2 normal. No murmurs, rubs, or gallops.  ABDOMEN: Soft, nondistended, nontender. Bowel sounds present. No organomegaly or mass.  EXTREMITIES: No pedal edema, cyanosis, or clubbing.  NEUROLOGIC: Cranial nerves II through XII are intact. Muscle strength 5/5 in all extremities. Sensation intact. Gait not checked.  PSYCHIATRIC: The patient is alert and oriented x 3.  Normal affect and good eye contact. SKIN: No obvious rash, lesion, or ulcer.   LABORATORY PANEL:   CBC Recent Labs  Lab 03/16/19 0208  WBC 5.5  HGB 15.6  HCT 48.8  PLT 266   ------------------------------------------------------------------------------------------------------------------  Chemistries  Recent Labs  Lab 03/16/19 0208  NA 137  K 3.4*  CL 109  CO2 17*  GLUCOSE 144*  BUN 25*  CREATININE 1.01  CALCIUM 8.7*  AST 51*  ALT 48*  ALKPHOS 80  BILITOT 0.5   ------------------------------------------------------------------------------------------------------------------  Cardiac Enzymes No results for input(s): TROPONINI in the last 168 hours. ------------------------------------------------------------------------------------------------------------------  RADIOLOGY:  Dg Chest Port 1 View  Result Date: 03/16/2019 CLINICAL DATA:  58 year old male with chest pain. EXAM: PORTABLE CHEST 1 VIEW COMPARISON:  Chest radiograph dated 04/02/2010 FINDINGS: There is eventration of the left hemidiaphragm. Bibasilar interstitial coarsening. No focal consolidation, pleural effusion, or pneumothorax. Stable cardiac silhouette. No acute osseous pathology. IMPRESSION: Mild interstitial coarsening primarily involving the lower lung field which may be chronic. Atypical infection is not excluded.  Clinical correlation is recommended. Electronically Signed   By: Anner Crete M.D.   On: 03/16/2019 02:31      IMPRESSION AND PLAN:   1.  Supraventricular tachycardia with palpitations and right-sided chest pain status post chemical cardioversion with IV adenosine.  Patient will be admitted to an observation telemetry bed.  Will follow serial hs troponin I.  Will obtain a cardiology consultation by Dr. Saralyn Pilar who was notified about the patient and a 2D echo in a.m. We will obtain a chest CTA to rule out PE and assess for any aortic dissection.  2.  Elevated HS troponin I.  Cardiology consult and 2D echo will be obtained as mentioned above and will follow more levels.  We will place the patient on aspirin as well as continue IV heparin drip.  3.  Hypokalemia.  Will aggressively replace potassium and check magnesium level.  4.  Hypertension.  We will continue Norvasc, lisinopril/HCT and labetalol.  5.  DVT prophylaxis.  The patient will be on IV heparin drip.  All the records are reviewed and case discussed with ED provider. The plan of care was discussed in details with the patient (and family). I answered all questions. The patient agreed to proceed with the above mentioned plan. Further management will depend upon hospital course.   CODE STATUS: Full code  TOTAL TIME TAKING CARE OF THIS PATIENT: 50 minutes.    Christel Mormon M.D on 03/16/2019 at 5:41 AM  Pager -  [phone]  After 6pm go to [email or web address] - Social research officer, [email or web address] [email or web address]  [facility]  Office  [phone]  CC: Primary care physician; Tamsen Roershrismon, Dennis E, PA   Note: This dictation was prepared with Dragon dictation along with smaller phrase technology. Any transcriptional errors that result from this process are unintentional.

## 2019-03-16 NOTE — ED Triage Notes (Signed)
Patient to ED for an elevated heart rate at home of 195. EMS gave 6 of adenosine with no relief. 12 mg of Adenosine brought heart rate to 85 and patient's chest pain resolved. Alert and oriented without pain or shortness of breath.

## 2019-03-16 NOTE — Consult Note (Signed)
[facility]  CARDIOLOGY CONSULT NOTE  Patient ID: Oscar Clark MRN: [id number] DOB/AGE: 59/12/1960 59 y.o.  Admit date: 03/16/2019 Referring Physician Allena KatzPatel Primary Physician Chrismon Primary Cardiologist  Reason for Consultation SVT  HPI: 58 year old gentleman referred for evaluation of SVT.  Patient [facility] emergency room with heart racing.  He was noted be in supraventricular tachycardia and treated with 6 and 12 mg IV adenosine with conversion to sinus rhythm.  CT revealed sinus rhythm at a rate of 88 bpm with left ventricular lipoatrophy.  Labs were notable for borderline elevated high-sensitivity troponin of 50 followed by 360.  The patient is very active, works 3 jobs, denies exertional chest pain.  Review of systems complete and found to be negative unless listed above     Past Medical History:  Diagnosis Date  . Hypertension     Past Surgical History:  Procedure Laterality Date  . KIDNEY STONE SURGERY      Medications Prior to Admission  Medication Sig Dispense Refill Last Dose  . amLODipine (NORVASC) 5 MG tablet Take 5 mg by mouth daily.     . cyclobenzaprine (FLEXERIL) 5 MG tablet Take 1 tablet (5 mg total) by mouth 3 (three) times daily as needed for muscle spasms. 30 tablet 1   . labetalol (NORMODYNE) 100 MG tablet Take 100 mg by mouth 2 (two) times daily.     Marland Kitchen. lisinopril-hydrochlorothiazide (PRINZIDE,ZESTORETIC) 10-12.5 MG tablet TAKE ONE TABLET BY MOUTH ONCE DAILY 30 tablet 3    Social History   Socioeconomic History  . Marital status: Single    Spouse name: Not on file  . Number of children: Not on file  . Years of education: Not on file  . Highest education level: Not on file  Occupational History  . Not on file  Social Needs  . Financial resource strain: Not on file  . Food insecurity    Worry: Not on file    Inability: Not on file  . Transportation needs    Medical: Not on file    Non-medical: Not on file  Tobacco Use  . Smoking  status: Current Some Day Smoker  . Smokeless tobacco: Never Used  Substance and Sexual Activity  . Alcohol use: Yes    Alcohol/week: 0.0 standard drinks    Comment: OCCASIONALLY  . Drug use: No  . Sexual activity: Not on file  Lifestyle  . Physical activity    Days per week: Not on file    Minutes per session: Not on file  . Stress: Not on file  Relationships  . Social Musicianconnections    Talks on phone: Not on file    Gets together: Not on file    Attends religious service: Not on file    Active member of club or organization: Not on file    Attends meetings of clubs or organizations: Not on file    Relationship status: Not on file  . Intimate partner violence    Fear of current or ex partner: Not on file    Emotionally abused: Not on file    Physically abused: Not on file    Forced sexual activity: Not on file  Other Topics Concern  . Not on file  Social History Narrative  . Not on file    No family history on file.    Review of systems complete and found to be negative unless listed above      PHYSICAL EXAM  General: Well developed, well nourished, in no  acute distress HEENT:  Normocephalic and atramatic Neck:  No JVD.  Lungs: Clear bilaterally to auscultation and percussion. Heart: HRRR . Normal S1 and S2 without gallops or murmurs.  Abdomen: Bowel sounds are positive, abdomen soft and non-tender  Msk:  Back normal, normal gait. Normal strength and tone for age. Extremities: No clubbing, cyanosis or edema.   Neuro: Alert and oriented X 3. Psych:  Good affect, responds appropriately  Labs:   Lab Results  Component Value Date   WBC 5.5 03/16/2019   HGB 15.6 03/16/2019   HCT 48.8 03/16/2019   MCV 86.7 03/16/2019   PLT 266 03/16/2019    Recent Labs  Lab 03/16/19 0208  NA 137  K 3.4*  CL 109  CO2 17*  BUN 25*  CREATININE 1.01  CALCIUM 8.7*  PROT 7.2  BILITOT 0.5  ALKPHOS 80  ALT 48*  AST 51*  GLUCOSE 144*   No results found for: CKTOTAL, CKMB,  CKMBINDEX, TROPONINI  Lab Results  Component Value Date   CHOL 166 03/16/2019   CHOL 202 (H) 06/11/2015   Lab Results  Component Value Date   HDL 43 03/16/2019   HDL 40 06/11/2015   Lab Results  Component Value Date   LDLCALC 112 (H) 03/16/2019   LDLCALC 121 (H) 06/11/2015   Lab Results  Component Value Date   TRIG 53 03/16/2019   TRIG 206 (H) 06/11/2015   Lab Results  Component Value Date   CHOLHDL 3.9 03/16/2019   CHOLHDL 5.1 (H) 06/11/2015   No results found for: LDLDIRECT    Radiology: Dg Chest Port 1 View  Result Date: 03/16/2019 CLINICAL DATA:  58 year old male with chest pain. EXAM: PORTABLE CHEST 1 VIEW COMPARISON:  Chest radiograph dated 04/02/2010 FINDINGS: There is eventration of the left hemidiaphragm. Bibasilar interstitial coarsening. No focal consolidation, pleural effusion, or pneumothorax. Stable cardiac silhouette. No acute osseous pathology. IMPRESSION: Mild interstitial coarsening primarily involving the lower lung field which may be chronic. Atypical infection is not excluded. Clinical correlation is recommended. Electronically Signed   By: Anner Crete M.D.   On: 03/16/2019 02:31   Ct Angio Chest/abd/pel For Dissection W And/or W/wo  Result Date: 03/16/2019 CLINICAL DATA:  Severe chest pain and back pain. Concern for dissection and pulmonary embolism.w EXAM: CT ANGIOGRAPHY CHEST, ABDOMEN AND PELVIS TECHNIQUE: Multidetector CT imaging through the chest, abdomen and pelvis was performed using the standard protocol during bolus administration of intravenous contrast. Multiplanar reconstructed images and MIPs were obtained and reviewed to evaluate the vascular anatomy. CONTRAST:  161mL ISOVUE-370 IOPAMIDOL (ISOVUE-370) INJECTION 76% COMPARISON:  None. FINDINGS: CTA CHEST FINDINGS Cardiovascular:  No thoracic aortic aneurysm or aortic dissection. Some of the peripheral segmental and subsegmental pulmonary artery branches are difficult to definitively  characterize due to mild patient breathing motion artifact, however, there is no pulmonary embolism identified within the main, lobar or central segmental pulmonary arteries bilaterally. Mediastinum/Nodes: No mass or enlarged lymph nodes seen within the mediastinum or perihilar regions. Esophagus appears normal. Trachea and central bronchi are unremarkable. Lungs/Pleura: Ground-glass consolidations at the lung bases bilaterally, LEFT slightly greater than RIGHT. Remainder of the lungs are clear bilaterally. No pleural effusion. Musculoskeletal: Mild degenerative spondylosis of the lower cervical spine and upper thoracic spine. Accentuated kyphosis. No acute or suspicious osseous finding. Review of the MIP images confirms the above findings. CTA ABDOMEN AND PELVIS FINDINGS VASCULAR Aorta: Normal caliber aorta without aneurysm, dissection, vasculitis or significant stenosis. Celiac: Patent without evidence of aneurysm, dissection, vasculitis or  significant stenosis. SMA: Patent without evidence of aneurysm, dissection, vasculitis or significant stenosis. Renals: Both renal arteries are patent without evidence of aneurysm, dissection, vasculitis, fibromuscular dysplasia or significant stenosis. IMA: Patent without evidence of aneurysm, dissection, vasculitis or significant stenosis. Inflow: Patent without evidence of aneurysm, dissection, vasculitis or significant stenosis. Veins: No obvious venous abnormality within the limitations of this arterial phase study. Review of the MIP images confirms the above findings. NON-VASCULAR Hepatobiliary: No focal liver abnormality is seen. No gallstones, gallbladder wall thickening, or biliary dilatation. Pancreas: Unremarkable. No pancreatic ductal dilatation or surrounding inflammatory changes. Spleen: Normal in size without focal abnormality. Adrenals/Urinary Tract: 1.5 cm hypodense mass within the upper pole of the LEFT kidney, compatible with a benign cyst based on CT density  measurements. Smaller indeterminate mass exophytic to the posterior cortex of the lower pole of the RIGHT kidney, measuring 1 cm greatest dimension, too small to definitively characterize. No renal stone or hydronephrosis bilaterally. No perinephric fluid. No ureteral or bladder calculi identified. Bladder appears normal. Stomach/Bowel: No dilated large or small bowel loops. No evidence of bowel wall inflammation. Appendix is normal. Stomach is unremarkable, partially decompressed. Lymphatic: No enlarged lymph nodes seen within the abdomen or pelvis. Reproductive: Prostate is unremarkable. Other: No free fluid or abscess collection. No free intraperitoneal air. Musculoskeletal: No acute or suspicious osseous finding. Mild degenerative spondylosis within the lower lumbar spine. Review of the MIP images confirms the above findings. IMPRESSION: 1. No thoracic or abdominal aortic aneurysm or aortic dissection. 2. No pulmonary embolism identified, with mild study limitations detailed above. 3. Ground-glass consolidations at the lung bases bilaterally, LEFT slightly greater than RIGHT, suspicious for multifocal pneumonia. Differential includes atypical pneumonias such as viral or fungal, interstitial pneumonias, edema related to volume overload/CHF, chronic interstitial diseases, hypersensitivity pneumonitis, and respiratory bronchiolitis. 4. No acute findings within the abdomen or pelvis. No bowel obstruction or evidence of bowel wall inflammation. No renal or ureteral calculi. No free fluid or inflammatory change. 5. **An incidental finding of potential clinical significance has been found. Indeterminate 1 cm mass exophytic to the lower pole of the RIGHT kidney, too small to definitively characterize. Recommend nonemergent renal MRI for further characterization.** Electronically Signed   By: Bary RichardStan  Maynard M.D.   On: 03/16/2019 07:26    EKG: Sinus rhythm at 88 bpm with LVH  ASSESSMENT AND PLAN:   1.  Elevated high  sensitive troponin, likely demand supply ischemia secondary to SVT 2.  SVT, converted to sinus rhythm with a adenosine  Recommendations  1.  DC heparin 2.  Review 2D echocardiogram 3.  Ambulate patient, if patient does well consider discharge home, follow-up as outpatient   Signed: Marcina Millardlexander Benford Asch MD,PhD, [facility] 03/16/2019, 9:01 AM

## 2019-03-16 NOTE — Progress Notes (Signed)
ANTICOAGULATION CONSULT NOTE - Initial Consult  Pharmacy Consult for heparin Indication: chest pain/ACS  No Known Allergies  Patient Measurements: Height: 5\' 9"  (175.3 cm) Weight: 178 lb (80.7 kg) IBW/kg (Calculated) : 70.7 Heparin Dosing Weight: 80.7 kg  Vital Signs: Temp: 98 F (36.7 C) (07/19 0200) Temp Source: Oral (07/19 0200) BP: 156/95 (07/19 0204) Pulse Rate: 100 (07/19 0200)  Labs: Recent Labs    03/16/19 0208 03/16/19 0424  HGB 15.6  --   HCT 48.8  --   PLT 266  --   CREATININE 1.01  --   TROPONINIHS 50* 360*    Estimated Creatinine Clearance: 79.7 mL/min (by C-G formula based on SCr of 1.01 mg/dL).   Medical History: Past Medical History:  Diagnosis Date  . Hypertension     Medications:  Scheduled:  . amLODipine  5 mg Oral Daily  . heparin  4,000 Units Intravenous Once  . labetalol  100 mg Oral BID  . lisinopril-hydrochlorothiazide  1 tablet Oral Daily  . potassium chloride  40 mEq Oral Once  . potassium chloride  40 mEq Oral Once    Assessment: Patient admitted for elevated heart rate in the 190's w/ CP PTA and when he was brought in was given 6 mg of adenosine and went down to 85 bpm. EKG showing ST elevation V1 and III. Initial trop was drawn and was 50 and rose to 360 ng/mL. Patient does not appear to take any anticoagulants PTA. Patient is being started on heparin drip for STEMI.  Goal of Therapy:  Heparin level 0.3-0.7 units/ml Monitor platelets by anticoagulation protocol: Yes   Plan:  Will bolus w/ heparin 4000 units IV x 1  Will start rate at 1150 units/hr  Will check anti-Xa @ 1100. Baseline CBC WNL, aPTT/INR pending. Will continue to monitor daily CBC's and adjust per anti-Xa levels.  Tobie Lords, PharmD, BCPS Clinical Pharmacist 03/16/2019,5:26 AM

## 2019-03-16 NOTE — ED Notes (Signed)
Report called  

## 2019-03-16 NOTE — Discharge Summary (Signed)
[facility] [place] [facility]   PATIENT NAME: Oscar Clark    MR#:  [id number]  DATE OF BIRTH:  09/25/1960  DATE OF ADMISSION:  03/16/2019 ADMITTING PHYSICIAN: Hannah BeatJan A Mansy, MD  DATE OF DISCHARGE:03/16/2019  PRIMARY CARE PHYSICIAN: Chrismon, Jodell Ciproennis E, PA    ADMISSION DIAGNOSIS:  SVT (supraventricular tachycardia) (HCC) [I47.1] Elevated troponin [R79.89] Chest pain, unspecified type [R07.9]  DISCHARGE DIAGNOSIS:  SVT--resolved  SECONDARY DIAGNOSIS:   Past Medical History:  Diagnosis Date  . Hypertension     HOSPITAL COURSE:   Oscar Clark  is a 58 y.o. male with a known history of hypertension and nephrolithiasis, who presented to the emergency room with a consider right-sided chest pain with associated palpitations that woke him up from sleep.  He was noted to be in SVT with a rate of 190 by EMS.  He was given 6 mg of IV adenosine followed by 12 mg and converted to sinus rhythm  1.  Supraventricular tachycardia with palpitations and right-sided chest pain status post chemical cardioversion with IV adenosine. -Patient remains in sinus rhythm. Borderline elevated troponin suspected demand ischemia due to SVT  - cardiology consultation by Dr. Darrold JunkerParaschos appreciated. No further workup recommended. Advised follow-up as outpatient  -CT chest negative for dissection  2.  Elevated HS troponin I.   -no chest pain. No acute EKG changes for MI. Appears demand ischemia from SVT. Cardiology recommends outpatient follow-up. Will continue aspirin 81 mg daily.  3.  Hypertension.  We will continue Norvasc, lisinopril/HCT and labetalol.  4.  DVT prophylaxis.   ambulation  5. Incidental finding of exophytic mass 1 cm on the right lower pole of the kidney noted on CT chest/abdomen. Will defer to primary care physician for outpatient follow-up.  Overall feels better. He is a bit tired and sleepy however hemodynamically stable. Will discharged home with  outpatient follow-up with cardiology. Patient agreeable with the plan.  CONSULTS OBTAINED:  Treatment Team:  Marcina MillardParaschos, Alexander, MD  DRUG ALLERGIES:  No Known Allergies  DISCHARGE MEDICATIONS:   Allergies as of 03/16/2019   No Known Allergies     Medication List    TAKE these medications   amLODipine 5 MG tablet Commonly known as: NORVASC Take 5 mg by mouth daily.   aspirin 81 MG EC tablet Take 1 tablet (81 mg total) by mouth daily. Start taking on: March 17, 2019   cyclobenzaprine 5 MG tablet Commonly known as: FLEXERIL Take 1 tablet (5 mg total) by mouth 3 (three) times daily as needed for muscle spasms.   labetalol 100 MG tablet Commonly known as: NORMODYNE Take 100 mg by mouth 2 (two) times daily.   lisinopril-hydrochlorothiazide 10-12.5 MG tablet Commonly known as: ZESTORETIC TAKE ONE TABLET BY MOUTH ONCE DAILY       If you experience worsening of your admission symptoms, develop shortness of breath, life threatening emergency, suicidal or homicidal thoughts you must seek medical attention immediately by calling 911 or calling your MD immediately  if symptoms less severe.  You Must read complete instructions/literature along with all the possible adverse reactions/side effects for all the Medicines you take and that have been prescribed to you. Take any new Medicines after you have completely understood and accept all the possible adverse reactions/side effects.   Please note  You were cared for by a hospitalist during your hospital stay. If you have any questions about your discharge medications or the care you received while you were in the hospital  after you are discharged, you can call the unit and asked to speak with the hospitalist on call if the hospitalist that took care of you is not available. Once you are discharged, your primary care physician will handle any further medical issues. Please note that NO REFILLS for any discharge medications will be  authorized once you are discharged, as it is imperative that you return to your primary care physician (or establish a relationship with a primary care physician if you do not have one) for your aftercare needs so that they can reassess your need for medications and monitor your lab values. Today   SUBJECTIVE   Patient is a bit sleepy from being in the ER all night. No chest pain. Feels better.  some acid reflux.  VITAL SIGNS:  Blood pressure (!) 155/99, pulse 61, temperature 98.3 F (36.8 C), temperature source Oral, resp. rate 18, height 5\' 9"  (1.753 m), weight 80.7 kg, SpO2 97 %.  I/O:    Intake/Output Summary (Last 24 hours) at 03/16/2019 1215 Last data filed at 03/16/2019 1000 Gross per 24 hour  Intake 72.75 ml  Output -  Net 72.75 ml    PHYSICAL EXAMINATION:  GENERAL:  58 y.o.-year-old patient lying in the bed with no acute distress.  EYES: Pupils equal, round, reactive to light and accommodation. No scleral icterus. Extraocular muscles intact.  HEENT: Head atraumatic, normocephalic. Oropharynx and nasopharynx clear.  NECK:  Supple, no jugular venous distention. No thyroid enlargement, no tenderness.  LUNGS: Normal breath sounds bilaterally, no wheezing, rales,rhonchi or crepitation. No use of accessory muscles of respiration.  CARDIOVASCULAR: S1, S2 normal. No murmurs, rubs, or gallops.  ABDOMEN: Soft, non-tender, non-distended. Bowel sounds present. No organomegaly or mass.  EXTREMITIES: No pedal edema, cyanosis, or clubbing.  NEUROLOGIC: Cranial nerves II through XII are intact. Muscle strength 5/5 in all extremities. Sensation intact. Gait not checked.  PSYCHIATRIC: The patient is alert and oriented x 3.  SKIN: No obvious rash, lesion, or ulcer.   DATA REVIEW:   CBC  Recent Labs  Lab 03/16/19 0208  WBC 5.5  HGB 15.6  HCT 48.8  PLT 266    Chemistries  Recent Labs  Lab 03/16/19 0208  NA 137  K 3.4*  CL 109  CO2 17*  GLUCOSE 144*  BUN 25*  CREATININE 1.01   CALCIUM 8.7*  MG 2.2  AST 51*  ALT 48*  ALKPHOS 80  BILITOT 0.5    Microbiology Results   Recent Results (from the past 240 hour(s))  SARS Coronavirus 2 (CEPHEID - Performed in [facility] hospital lab), Hosp Order     Status: None   Collection Time: 03/16/19  2:09 AM   Specimen: Nasopharyngeal Swab  Result Value Ref Range Status   SARS Coronavirus 2 NEGATIVE NEGATIVE Final    Comment: (NOTE) If result is NEGATIVE SARS-CoV-2 target nucleic acids are NOT DETECTED. The SARS-CoV-2 RNA is generally detectable in upper and lower  respiratory specimens during the acute phase of infection. The lowest  concentration of SARS-CoV-2 viral copies this assay can detect is 250  copies / mL. A negative result does not preclude SARS-CoV-2 infection  and should not be used as the sole basis for treatment or other  patient management decisions.  A negative result may occur with  improper specimen collection / handling, submission of specimen other  than nasopharyngeal swab, presence of viral mutation(s) within the  areas targeted by this assay, and inadequate number of viral copies  (<250 copies /  mL). A negative result must be combined with clinical  observations, patient history, and epidemiological information. If result is POSITIVE SARS-CoV-2 target nucleic acids are DETECTED. The SARS-CoV-2 RNA is generally detectable in upper and lower  respiratory specimens dur ing the acute phase of infection.  Positive  results are indicative of active infection with SARS-CoV-2.  Clinical  correlation with patient history and other diagnostic information is  necessary to determine patient infection status.  Positive results do  not rule out bacterial infection or co-infection with other viruses. If result is PRESUMPTIVE POSTIVE SARS-CoV-2 nucleic acids MAY BE PRESENT.   A presumptive positive result was obtained on the submitted specimen  and confirmed on repeat testing.  While 2019 novel  coronavirus  (SARS-CoV-2) nucleic acids may be present in the submitted sample  additional confirmatory testing may be necessary for epidemiological  and / or clinical management purposes  to differentiate between  SARS-CoV-2 and other Sarbecovirus currently known to infect humans.  If clinically indicated additional testing with an alternate test  methodology [phone]) is advised. The SARS-CoV-2 RNA is generally  detectable in upper and lower respiratory sp ecimens during the acute  phase of infection. The expected result is Negative. Fact Sheet for Patients:  StrictlyIdeas.no Fact Sheet for Healthcare Providers: BankingDealers.co.za This test is not yet approved or cleared by the [place] FDA and has been authorized for detection and/or diagnosis of SARS-CoV-2 by FDA under an Emergency Use Authorization (EUA).  This EUA will remain in effect (meaning this test can be used) for the duration of the COVID-19 declaration under Section 564(b)(1) of the Act, 21 U.S.C. section 360bbb-3(b)(1), unless the authorization is terminated or revoked sooner. Performed at [facility], [address] [place], [place] [address]     RADIOLOGY:  Dg Chest Port 1 View  Result Date: 03/16/2019 CLINICAL DATA:  58 year old male with chest pain. EXAM: PORTABLE CHEST 1 VIEW COMPARISON:  Chest radiograph dated 04/02/2010 FINDINGS: There is eventration of the left hemidiaphragm. Bibasilar interstitial coarsening. No focal consolidation, pleural effusion, or pneumothorax. Stable cardiac silhouette. No acute osseous pathology. IMPRESSION: Mild interstitial coarsening primarily involving the lower lung field which may be chronic. Atypical infection is not excluded. Clinical correlation is recommended. Electronically Signed   By: Anner Crete M.D.   On: 03/16/2019 02:31   Ct Angio Chest/abd/pel For Dissection W And/or W/wo  Result Date:  03/16/2019 CLINICAL DATA:  Severe chest pain and back pain. Concern for dissection and pulmonary embolism.w EXAM: CT ANGIOGRAPHY CHEST, ABDOMEN AND PELVIS TECHNIQUE: Multidetector CT imaging through the chest, abdomen and pelvis was performed using the standard protocol during bolus administration of intravenous contrast. Multiplanar reconstructed images and MIPs were obtained and reviewed to evaluate the vascular anatomy. CONTRAST:  192mL ISOVUE-370 IOPAMIDOL (ISOVUE-370) INJECTION 76% COMPARISON:  None. FINDINGS: CTA CHEST FINDINGS Cardiovascular:  No thoracic aortic aneurysm or aortic dissection. Some of the peripheral segmental and subsegmental pulmonary artery branches are difficult to definitively characterize due to mild patient breathing motion artifact, however, there is no pulmonary embolism identified within the main, lobar or central segmental pulmonary arteries bilaterally. Mediastinum/Nodes: No mass or enlarged lymph nodes seen within the mediastinum or perihilar regions. Esophagus appears normal. Trachea and central bronchi are unremarkable. Lungs/Pleura: Ground-glass consolidations at the lung bases bilaterally, LEFT slightly greater than RIGHT. Remainder of the lungs are clear bilaterally. No pleural effusion. Musculoskeletal: Mild degenerative spondylosis of the lower cervical spine and upper thoracic spine. Accentuated kyphosis. No acute or suspicious osseous finding.  Review of the MIP images confirms the above findings. CTA ABDOMEN AND PELVIS FINDINGS VASCULAR Aorta: Normal caliber aorta without aneurysm, dissection, vasculitis or significant stenosis. Celiac: Patent without evidence of aneurysm, dissection, vasculitis or significant stenosis. SMA: Patent without evidence of aneurysm, dissection, vasculitis or significant stenosis. Renals: Both renal arteries are patent without evidence of aneurysm, dissection, vasculitis, fibromuscular dysplasia or significant stenosis. IMA: Patent without  evidence of aneurysm, dissection, vasculitis or significant stenosis. Inflow: Patent without evidence of aneurysm, dissection, vasculitis or significant stenosis. Veins: No obvious venous abnormality within the limitations of this arterial phase study. Review of the MIP images confirms the above findings. NON-VASCULAR Hepatobiliary: No focal liver abnormality is seen. No gallstones, gallbladder wall thickening, or biliary dilatation. Pancreas: Unremarkable. No pancreatic ductal dilatation or surrounding inflammatory changes. Spleen: Normal in size without focal abnormality. Adrenals/Urinary Tract: 1.5 cm hypodense mass within the upper pole of the LEFT kidney, compatible with a benign cyst based on CT density measurements. Smaller indeterminate mass exophytic to the posterior cortex of the lower pole of the RIGHT kidney, measuring 1 cm greatest dimension, too small to definitively characterize. No renal stone or hydronephrosis bilaterally. No perinephric fluid. No ureteral or bladder calculi identified. Bladder appears normal. Stomach/Bowel: No dilated large or small bowel loops. No evidence of bowel wall inflammation. Appendix is normal. Stomach is unremarkable, partially decompressed. Lymphatic: No enlarged lymph nodes seen within the abdomen or pelvis. Reproductive: Prostate is unremarkable. Other: No free fluid or abscess collection. No free intraperitoneal air. Musculoskeletal: No acute or suspicious osseous finding. Mild degenerative spondylosis within the lower lumbar spine. Review of the MIP images confirms the above findings. IMPRESSION: 1. No thoracic or abdominal aortic aneurysm or aortic dissection. 2. No pulmonary embolism identified, with mild study limitations detailed above. 3. Ground-glass consolidations at the lung bases bilaterally, LEFT slightly greater than RIGHT, suspicious for multifocal pneumonia. Differential includes atypical pneumonias such as viral or fungal, interstitial pneumonias,  edema related to volume overload/CHF, chronic interstitial diseases, hypersensitivity pneumonitis, and respiratory bronchiolitis. 4. No acute findings within the abdomen or pelvis. No bowel obstruction or evidence of bowel wall inflammation. No renal or ureteral calculi. No free fluid or inflammatory change. 5. **An incidental finding of potential clinical significance has been found. Indeterminate 1 cm mass exophytic to the lower pole of the RIGHT kidney, too small to definitively characterize. Recommend nonemergent renal MRI for further characterization.** Electronically Signed   By: Bary RichardStan  Maynard M.D.   On: 03/16/2019 07:26     CODE STATUS:     Code Status Orders  (From admission, onward)         Start     Ordered   03/16/19 0525  Full code  Continuous     03/16/19 0536        Code Status History    This patient has a current code status but no historical code status.   Advance Care Planning Activity      TOTAL TIME TAKING CARE OF THIS PATIENT: *40* minutes.    Enedina FinnerSona Smrithi Pigford M.D on 03/16/2019 at 12:15 PM  Between 7am to 6pm - Pager - [phone] After 6pm go to [email or web address] - Social research officer, governmentpassword EPAS [email or web address]  [facility]  Office  [phone]  CC: Primary care physician; Chrismon, Jodell Ciproennis E, PA

## 2019-03-16 NOTE — ED Notes (Signed)
Blood drawn and sent to lab.

## 2019-03-16 NOTE — Discharge Instructions (Signed)
Avoid drinks with Caffeine Advised smoking cessation

## 2019-03-16 NOTE — ED Provider Notes (Addendum)
[facility] Emergency Department Provider Note   ____________________________________________   First MD Initiated Contact with Patient 03/16/19 0159     (approximate)  I have reviewed the triage vital signs and the nursing notes.   HISTORY  Chief Complaint Chest Pain    HPI Oscar Clark is a 58 y.o. male brought to the ED from home via EMS with a chief complaint of chest pain and SVT.  Patient states he awoke with pain in his right chest and shoulder blade, sweating and feeling short of breath.  EMS reports initial heart rate 195.  Patient was given 6 mg of IV adenosine without relief of symptoms.  Additional 12 mg IV adenosine brought heart rate back to normal sinus rhythm at a rate of 85 and patient's chest pain resolved.  Patient denies similar symptoms previously.  Denies recent fever, cough, abdominal pain, nausea, vomiting, diarrhea.  Denies recent travel, trauma, hormone use or exposure to persons diagnosed with coronavirus.  Denies caffeine overuse, alcohol or illicit drug use.       Past Medical History:  Diagnosis Date   Hypertension     Patient Active Problem List   Diagnosis Date Noted   Kidney stone 06/11/2015    Class: History of   Muscle spasm 06/11/2015   BP (high blood pressure) 03/25/2014    Past Surgical History:  Procedure Laterality Date   KIDNEY STONE SURGERY      Prior to Admission medications   Medication Sig Start Date End Date Taking? Authorizing Provider  amLODipine (NORVASC) 5 MG tablet Take 5 mg by mouth daily.    [provider]  cyclobenzaprine (FLEXERIL) 5 MG tablet Take 1 tablet (5 mg total) by mouth 3 (three) times daily as needed for muscle spasms. 08/01/16   Chrismon, Jodell Ciproennis E, PA  labetalol (NORMODYNE) 100 MG tablet Take 100 mg by mouth 2 (two) times daily.    [provider]  lisinopril-hydrochlorothiazide (PRINZIDE,ZESTORETIC) 10-12.5 MG tablet TAKE ONE TABLET BY MOUTH ONCE DAILY  12/07/16   Chrismon, Jodell Ciproennis E, PA    Allergies Patient has no known allergies.  No family history on file.  Social History Social History   Tobacco Use   Smoking status: Current Some Day Smoker   Smokeless tobacco: Never Used  Substance Use Topics   Alcohol use: Yes    Alcohol/week: 0.0 standard drinks    Comment: OCCASIONALLY   Drug use: No    Review of Systems  Constitutional: No fever/chills Eyes: No visual changes. ENT: No sore throat. Cardiovascular: Positive for chest pain and palpitations. Respiratory: Positive for shortness of breath. Gastrointestinal: No abdominal pain.  No nausea, no vomiting.  No diarrhea.  No constipation. Genitourinary: Negative for dysuria. Musculoskeletal: Negative for back pain. Skin: Negative for rash. Neurological: Negative for headaches, focal weakness or numbness.   ____________________________________________   PHYSICAL EXAM:  VITAL SIGNS: ED Triage Vitals  Enc Vitals Group     BP      Pulse      Resp      Temp      Temp src      SpO2      Weight      Height      Head Circumference      Peak Flow      Pain Score      Pain Loc      Pain Edu?      Excl. in GC?     Constitutional: Alert and  oriented. Well appearing and in no acute distress. Eyes: Conjunctivae are normal. PERRL. EOMI. Head: Atraumatic. Nose: No congestion/rhinnorhea. Mouth/Throat: Mucous membranes are moist.  Oropharynx non-erythematous. Neck: No stridor.   Cardiovascular: Normal rate, regular rhythm. Grossly normal heart sounds.  Good peripheral circulation. Respiratory: Normal respiratory effort.  No retractions. Lungs CTAB. Gastrointestinal: Soft and nontender. No distention. No abdominal bruits. No CVA tenderness. Musculoskeletal: No lower extremity tenderness nor edema.  No joint effusions. Neurologic:  Normal speech and language. No gross focal neurologic deficits are appreciated.  Skin:  Skin is warm, dry and intact. No rash  noted. Psychiatric: Mood and affect are normal. Speech and behavior are normal.  ____________________________________________   LABS (all labs ordered are listed, but only abnormal results are displayed)  Labs Reviewed  COMPREHENSIVE METABOLIC PANEL - Abnormal; Notable for the following components:      Result Value   Potassium 3.4 (*)    CO2 17 (*)    Glucose, Bld 144 (*)    BUN 25 (*)    Calcium 8.7 (*)    AST 51 (*)    ALT 48 (*)    All other components within normal limits  TROPONIN I (HIGH SENSITIVITY) - Abnormal; Notable for the following components:   Troponin I (High Sensitivity) 50 (*)    All other components within normal limits  TROPONIN I (HIGH SENSITIVITY) - Abnormal; Notable for the following components:   Troponin I (High Sensitivity) 360 (*)    All other components within normal limits  SARS CORONAVIRUS 2 (HOSPITAL ORDER, PERFORMED IN [facility])  CBC WITH DIFFERENTIAL/PLATELET   ____________________________________________  EKG  ED ECG REPORT I, Brice Potteiger J, the attending physician, personally viewed and interpreted this ECG.   Date: 03/16/2019  EKG Time: 0201  Rate: 88  Rhythm: normal EKG, normal sinus rhythm  Axis: Normal  Intervals:LVH  ST&T Change: Hyperacute T waves  ____________________________________________  RADIOLOGY  ED MD interpretation: Possibly chronic interstitial coarsening  Official radiology report(s): Dg Chest Port 1 View  Result Date: 03/16/2019 CLINICAL DATA:  58 year old male with chest pain. EXAM: PORTABLE CHEST 1 VIEW COMPARISON:  Chest radiograph dated 04/02/2010 FINDINGS: There is eventration of the left hemidiaphragm. Bibasilar interstitial coarsening. No focal consolidation, pleural effusion, or pneumothorax. Stable cardiac silhouette. No acute osseous pathology. IMPRESSION: Mild interstitial coarsening primarily involving the lower lung field which may be chronic. Atypical infection is not excluded.  Clinical correlation is recommended. Electronically Signed   By: Elgie CollardArash  Radparvar M.D.   On: 03/16/2019 02:31    ____________________________________________   PROCEDURES  Procedure(s) performed (including Critical Care):  Procedures  CRITICAL CARE Performed by: Irean HongSUNG,Adriann Ballweg J   Total critical care time: 30 minutes  Critical care time was exclusive of separately billable procedures and treating other patients.  Critical care was necessary to treat or prevent imminent or life-threatening deterioration.  Critical care was time spent personally by me on the following activities: development of treatment plan with patient and/or surrogate as well as nursing, discussions with consultants, evaluation of patient's response to treatment, examination of patient, obtaining history from patient or surrogate, ordering and performing treatments and interventions, ordering and review of laboratory studies, ordering and review of radiographic studies, pulse oximetry and re-evaluation of patient's condition. ____________________________________________   INITIAL IMPRESSION / ASSESSMENT AND PLAN / ED COURSE  As part of my medical decision making, I reviewed the following data within the electronic MEDICAL RECORD NUMBER Nursing notes reviewed and incorporated, Labs reviewed, EKG interpreted, Old chart  reviewed, Radiograph reviewed, Discussed with admitting physician and Notes from prior ED visits     Oscar Clark was evaluated in Emergency Department on 03/16/2019 for the symptoms described in the history of present illness. He was evaluated in the context of the global COVID-19 pandemic, which necessitated consideration that the patient might be at risk for infection with the SARS-CoV-2 virus that causes COVID-19. Institutional protocols and algorithms that pertain to the evaluation of patients at risk for COVID-19 are in a state of rapid change based on information released by regulatory bodies including the  CDC and federal and state organizations. These policies and algorithms were followed during the patient's care in the ED.   58 year old male who presents s/p conversion of SVT to NSR. Differential diagnosis includes, but is not limited to, ACS, aortic dissection, pulmonary embolism, cardiac tamponade, pneumothorax, pneumonia, pericarditis, myocarditis, GI-related causes including esophagitis/gastritis, and musculoskeletal chest wall pain.    Will obtain cardiac labs, CXR, Covid swab in anticipation of hospitalization.   Clinical Course as of Mar 15 518  Sun Mar 16, 2019  0338 Patient resting comfortably in no acute distress.  No complaints of chest pain since administration of IV adenosine.  We discussed options for hospitalization versus repeating troponin.  Patient states he ultimately would like to go home so we will repeat the troponin.   [JS]  0518 Updated patient on repeat troponin.  I strongly urged him to stay as his troponin has now increased by a factor of 7.  Patient is agreeable to stay.  Will initiate heparin bolus and drip.  Discussed case with Dr. Normand Sloop from hospitalist services who will evaluate patient in the ED for admission.   [JS]    Clinical Course User Index [JS] Paulette Blanch, MD     ____________________________________________   FINAL CLINICAL IMPRESSION(S) / ED DIAGNOSES  Final diagnoses:  Chest pain, unspecified type  SVT (supraventricular tachycardia) ([place])  Elevated troponin     ED Discharge Orders    None       Note:  This document was prepared using Dragon voice recognition software and may include unintentional dictation errors.   Paulette Blanch, MD 03/16/19 4401    Paulette Blanch, MD 04/03/19 [phone]

## 2019-03-16 NOTE — ED Notes (Signed)
ED TO INPATIENT HANDOFF REPORT  ED Nurse Name and Phone #: Danelle Earthlyoel [phone]   S Name/Age/Gender Montey HoraBarry Dockstader 58 y.o. male Room/Bed: ED08A/ED08A  Code Status   Code Status: Full Code  Home/SNF/Other Home Patient oriented to: self, place, time and situation Is this baseline? Yes   Triage Complete: Triage complete  Chief Complaint Tacycardia  Triage Note Patient to ED for an elevated heart rate at home of 195. EMS gave 6 of adenosine with no relief. 12 mg of Adenosine brought heart rate to 85 and patient's chest pain resolved. Alert and oriented without pain or shortness of breath.    Allergies No Known Allergies  Level of Care/Admitting Diagnosis ED Disposition    ED Disposition Condition Comment   Admit  [facility]ospital [facility] [[id number]]  Level of Care: Telemetry [5]  Covid Evaluation: Confirmed COVID Negative  Diagnosis: SVT (supraventricular tachycardia) [facility](HCC) [[id number]]  Admitting Physician: Hannah BeatMANSY, JAN A [[id number]]  Attending Physician: Hannah BeatMANSY, JAN A [[id number]]  PT Class (Do Not Modify): Observation [104]  PT Acc Code (Do Not Modify): Observation [10022]       B Medical/Surgery History Past Medical History:  Diagnosis Date  . Hypertension    Past Surgical History:  Procedure Laterality Date  . KIDNEY STONE SURGERY       A IV Location/Drains/Wounds Patient Lines/Drains/Airways Status   Active Line/Drains/Airways    Name:   Placement date:   Placement time:   Site:   Days:   Peripheral IV 03/16/19 Right Forearm   03/16/19    0206    Forearm   less than 1          Intake/Output Last 24 hours No intake or output data in the 24 hours ending 03/16/19 0622  Labs/Imaging Results for orders placed or performed during the hospital encounter of 03/16/19 (from the past 48 hour(s))  CBC with Differential     Status: None   Collection Time: 03/16/19  2:08 AM  Result Value Ref Range   WBC 5.5 4.0 - 10.5 K/uL   RBC 5.63 4.22 - 5.81 MIL/uL   Hemoglobin 15.6 13.0 - 17.0 g/dL   HCT 36.648.8 44.039.0 - 34.752.0 %   MCV 86.7 80.0 - 100.0 fL   MCH 27.7 26.0 - 34.0 pg   MCHC 32.0 30.0 - 36.0 g/dL   RDW 42.513.6 95.611.5 - 38.715.5 %   Platelets 266 150 - 400 K/uL   nRBC 0.0 0.0 - 0.2 %   Neutrophils Relative % 69 %   Neutro Abs 3.8 1.7 - 7.7 K/uL   Lymphocytes Relative 23 %   Lymphs Abs 1.3 0.7 - 4.0 K/uL   Monocytes Relative 5 %   Monocytes Absolute 0.3 0.1 - 1.0 K/uL   Eosinophils Relative 2 %   Eosinophils Absolute 0.1 0.0 - 0.5 K/uL   Basophils Relative 1 %   Basophils Absolute 0.0 0.0 - 0.1 K/uL   Immature Granulocytes 0 %   Abs Immature Granulocytes 0.01 0.00 - 0.07 K/uL    Comment: Performed at [facility], [address] [place], [place] [address]  Comprehensive metabolic panel     Status: Abnormal   Collection Time: 03/16/19  2:08 AM  Result Value Ref Range   Sodium 137 135 - 145 mmol/L   Potassium 3.4 (L) 3.5 - 5.1 mmol/L   Chloride 109 98 - 111 mmol/L   CO2 17 (L) 22 - 32 mmol/L   Glucose, Bld 144 (H) 70 - 99 mg/dL  BUN 25 (H) 6 - 20 mg/dL   Creatinine, Ser 1.01 0.61 - 1.24 mg/dL   Calcium 8.7 (L) 8.9 - 10.3 mg/dL   Total Protein 7.2 6.5 - 8.1 g/dL   Albumin 4.0 3.5 - 5.0 g/dL   AST 51 (H) 15 - 41 U/L   ALT 48 (H) 0 - 44 U/L   Alkaline Phosphatase 80 38 - 126 U/L   Total Bilirubin 0.5 0.3 - 1.2 mg/dL   GFR calc non Af Amer >60 >60 mL/min   GFR calc Af Amer >60 >60 mL/min   Anion gap 11 5 - 15    Comment: Performed at [facility], [place], [place] [address]  Troponin I (High Sensitivity)     Status: Abnormal   Collection Time: 03/16/19  2:08 AM  Result Value Ref Range   Troponin I (High Sensitivity) 50 (H) <18 ng/L    Comment: (NOTE) Elevated high sensitivity troponin I (hsTnI) values and significant  changes across serial measurements may suggest ACS but many other  chronic and acute conditions are known to elevate hsTnI results.  Refer to the "Links" section for chest pain  algorithms and additional  guidance. Performed at [facility], [address] [place], [place] [address]   Magnesium     Status: None   Collection Time: 03/16/19  2:08 AM  Result Value Ref Range   Magnesium 2.2 1.7 - 2.4 mg/dL    Comment: Performed at [facility], [address] [place], [place] [address]  APTT     Status: None   Collection Time: 03/16/19  2:08 AM  Result Value Ref Range   aPTT 31 24 - 36 seconds    Comment: Performed at [facility], [address] [place], [place] [address]  Protime-INR     Status: None   Collection Time: 03/16/19  2:08 AM  Result Value Ref Range   Prothrombin Time 12.7 11.4 - 15.2 seconds   INR 1.0 0.8 - 1.2    Comment: (NOTE) INR goal varies based on device and disease states. Performed at [facility], [address], [place], [place] [address]   SARS Coronavirus 2 (CEPHEID - Performed in [facility] hospital lab), Hosp Order     Status: None   Collection Time: 03/16/19  2:09 AM   Specimen: Nasopharyngeal Swab  Result Value Ref Range   SARS Coronavirus 2 NEGATIVE NEGATIVE    Comment: (NOTE) If result is NEGATIVE SARS-CoV-2 target nucleic acids are NOT DETECTED. The SARS-CoV-2 RNA is generally detectable in upper and lower  respiratory specimens during the acute phase of infection. The lowest  concentration of SARS-CoV-2 viral copies this assay can detect is 250  copies / mL. A negative result does not preclude SARS-CoV-2 infection  and should not be used as the sole basis for treatment or other  patient management decisions.  A negative result may occur with  improper specimen collection / handling, submission of specimen other  than nasopharyngeal swab, presence of viral mutation(s) within the  areas targeted by this assay, and inadequate number of viral copies  (<250 copies / mL). A negative result must be combined with clinical  observations, patient history, and epidemiological  information. If result is POSITIVE SARS-CoV-2 target nucleic acids are DETECTED. The SARS-CoV-2 RNA is generally detectable in upper and lower  respiratory specimens dur ing the acute phase of infection.  Positive  results are indicative of active infection with SARS-CoV-2.  Clinical  correlation with patient history and other diagnostic information is  necessary to determine patient infection status.  Positive results do  not rule out bacterial infection or co-infection with other viruses. If result is PRESUMPTIVE POSTIVE SARS-CoV-2 nucleic acids MAY BE PRESENT.   A presumptive positive result was obtained on the submitted specimen  and confirmed on repeat testing.  While 2019 novel coronavirus  (SARS-CoV-2) nucleic acids may be present in the submitted sample  additional confirmatory testing may be necessary for epidemiological  and / or clinical management purposes  to differentiate between  SARS-CoV-2 and other Sarbecovirus currently known to infect humans.  If clinically indicated additional testing with an alternate test  methodology [phone]) is advised. The SARS-CoV-2 RNA is generally  detectable in upper and lower respiratory sp ecimens during the acute  phase of infection. The expected result is Negative. Fact Sheet for Patients:  BoilerBrush.com.cyhttps://www.fda.gov/media/136312/download Fact Sheet for Healthcare Providers: https://pope.com/https://www.fda.gov/media/136313/download This test is not yet approved or cleared by the [place] FDA and has been authorized for detection and/or diagnosis of SARS-CoV-2 by FDA under an Emergency Use Authorization (EUA).  This EUA will remain in effect (meaning this test can be used) for the duration of the COVID-19 declaration under Section 564(b)(1) of the Act, 21 U.S.C. section 360bbb-3(b)(1), unless the authorization is terminated or revoked sooner. Performed at [facility], [address] [place], [place] [address]   Troponin I (High  Sensitivity)     Status: Abnormal   Collection Time: 03/16/19  4:24 AM  Result Value Ref Range   Troponin I (High Sensitivity) 360 (HH) <18 ng/L    Comment: CRITICAL RESULT CALLED TO, READ BACK BY AND VERIFIED WITH Wilsie Kern WEBSTER AT 0509 ON 03/16/19 RWW (NOTE) Elevated high sensitivity troponin I (hsTnI) values and significant  changes across serial measurements may suggest ACS but many other  chronic and acute conditions are known to elevate hsTnI results.  Refer to the "Links" section for chest pain algorithms and additional  guidance. Performed at [facility], [address] [place], [place] [address]    Dg Chest [place]  Result Date: 03/16/2019 CLINICAL DATA:  58 year old male with chest pain. EXAM: PORTABLE CHEST 1 VIEW COMPARISON:  Chest radiograph dated 04/02/2010 FINDINGS: There is eventration of the left hemidiaphragm. Bibasilar interstitial coarsening. No focal consolidation, pleural effusion, or pneumothorax. Stable cardiac silhouette. No acute osseous pathology. IMPRESSION: Mild interstitial coarsening primarily involving the lower lung field which may be chronic. Atypical infection is not excluded. Clinical correlation is recommended. Electronically Signed   By: Elgie CollardArash  Radparvar M.D.   On: 03/16/2019 02:31    Pending Labs Unresulted Labs (From admission, onward)    Start     Ordered   03/16/19 1130  Heparin level (unfractionated)  Once-Timed,   STAT     03/16/19 0526   03/16/19 0526  Lipid panel  Once,   STAT     03/16/19 0536   03/16/19 0524  HIV antibody (Routine Testing)  Once,   STAT     03/16/19 0536          Vitals/Pain Today's Vitals   03/16/19 0300 03/16/19 0400 03/16/19 0500 03/16/19 0600  BP: (!) 140/96 (!) 144/98 (!) 144/92 (!) 145/95  Pulse: 84 78 75 65  Resp: 20 20 19 16   Temp:      TempSrc:      SpO2: 94% 95% 95% 97%  Weight:      Height:      PainSc:  Isolation Precautions No active isolations  Medications Medications   heparin ADULT infusion 100 units/mL (25000 units/22850mL sodium chloride 0.45%) (1,150 Units/hr Intravenous New Bag/Given 03/16/19 0610)  amLODipine (NORVASC) tablet 5 mg (has no administration in time range)  labetalol (NORMODYNE) tablet 100 mg (has no administration in time range)  potassium chloride (KLOR-CON) packet 40 mEq (has no administration in time range)  cyclobenzaprine (FLEXERIL) tablet 5 mg (has no administration in time range)  acetaminophen (TYLENOL) tablet 650 mg (has no administration in time range)  ondansetron (ZOFRAN) injection 4 mg (has no administration in time range)  aspirin EC tablet 81 mg (has no administration in time range)  zolpidem (AMBIEN) tablet 5 mg (has no administration in time range)  lisinopril (ZESTRIL) tablet 10 mg (has no administration in time range)    And  hydrochlorothiazide (MICROZIDE) capsule 12.5 mg (has no administration in time range)  metoprolol tartrate (LOPRESSOR) tablet 12.5 mg (has no administration in time range)  heparin injection 4,000 Units (4,000 Units Intravenous Given 03/16/19 0614)  potassium chloride (KLOR-CON) packet 40 mEq (40 mEq Oral Given 03/16/19 0607)  alum & mag hydroxide-simeth (MAALOX/MYLANTA) 200-200-20 MG/5ML suspension 30 mL (30 mLs Oral Given 03/16/19 0619)    And  lidocaine (XYLOCAINE) 2 % viscous mouth solution 15 mL (15 mLs Oral Given 03/16/19 [id number])    Mobility walks Low fall risk   Focused Assessments Cardiac Assessment Handoff:  Cardiac Rhythm: Normal sinus rhythm No results found for: CKTOTAL, CKMB, CKMBINDEX, TROPONINI No results found for: DDIMER Does the Patient currently have chest pain? No      R Recommendations: See Admitting Provider Note  Report given to:   Additional Notes: pt lives alone

## 2019-03-16 NOTE — Progress Notes (Signed)
Patient ambulated around nurses station. No c/o chest pain. C/o burning mid epigastric to shoulder. Heart rate remained in the 80'2. Will let dr. Posey Pronto know per her request.

## 2019-03-16 NOTE — ED Notes (Signed)
Floor called for pt coming from CT

## 2019-03-16 NOTE — Progress Notes (Signed)
Oscar Clark to be D/C'd Home per MD order.  Discussed prescriptions and follow up appointments with the patient. Prescriptions electronically submitted, medication list explained in detail. Pt verbalized understanding.  Allergies as of 03/16/2019   No Known Allergies     Medication List    TAKE these medications   amLODipine 5 MG tablet Commonly known as: NORVASC Take 5 mg by mouth daily.   aspirin 81 MG EC tablet Take 1 tablet (81 mg total) by mouth daily. Start taking on: March 17, 2019   cyclobenzaprine 5 MG tablet Commonly known as: FLEXERIL Take 1 tablet (5 mg total) by mouth 3 (three) times daily as needed for muscle spasms.   labetalol 100 MG tablet Commonly known as: NORMODYNE Take 100 mg by mouth 2 (two) times daily. Notes to patient: Take second dose this afternoon   lisinopril-hydrochlorothiazide 10-12.5 MG tablet Commonly known as: ZESTORETIC TAKE ONE TABLET BY MOUTH ONCE DAILY       Vitals:   03/16/19 0940 03/16/19 1143  BP: (!) 161/108 (!) 155/99  Pulse: 62 61  Resp:    Temp:    SpO2:      Skin clean, dry and intact without evidence of skin break down, no evidence of skin tears noted. IV catheter discontinued intact. Site without signs and symptoms of complications. Dressing and pressure applied. Pt denies pain at this time. No complaints noted. Work note given to patient  An After Visit Summary was printed and given to the patient. Patient escorted via [place], and D/C home via private auto.  [place]

## 2019-03-17 ENCOUNTER — Telehealth: Payer: Self-pay

## 2019-03-17 NOTE — Telephone Encounter (Signed)
[id number] 

## 2019-03-17 NOTE — Telephone Encounter (Signed)
-----   Message from [place], [place] sent at 03/17/2019 11:07 AM EDT ----- ER describes a burning discomfort in the epigastrium radiating to the shoulder. May try (OTC)Prilosec 20 mg BID. If no better (or worsening discomfort) in 3-4 days, should schedule follow up with Dr. Saralyn Pilar. Schedule virtual visit and check BP pri or to connection.

## 2019-03-17 NOTE — Progress Notes (Signed)
ER describes a burning discomfort in the epigastrium radiating to the shoulder. May try (OTC)Prilosec 20 mg BID. If no better (or worsening discomfort) in 3-4 days, should schedule follow up with Dr. Saralyn Pilar. Schedule virtual visit and check BP prior to connection.

## 2019-03-18 ENCOUNTER — Telehealth: Payer: Self-pay

## 2019-03-18 LAB — HIV ANTIBODY (ROUTINE TESTING W REFLEX): HIV Screen 4th Generation wRfx: NONREACTIVE

## 2019-03-18 NOTE — Telephone Encounter (Signed)
[email or web address] 

## 2019-03-18 NOTE — Telephone Encounter (Signed)
-----   Message from [place]o [place], [place] sent at 03/18/2019 12:11 PM EDT ----- HIV test negative.

## 2019-03-20 NOTE — Telephone Encounter (Signed)
[email or web address] 

## 2019-03-24 NOTE — Telephone Encounter (Signed)
Left message advising pt.  (Per DPR)  Thanks,   -Lasaundra Riche  

## 2019-03-24 NOTE — Telephone Encounter (Signed)
LMTCB 03/24/2019  Thanks,   -Demontrae Gilbert  

## 2019-03-28 NOTE — Telephone Encounter (Signed)
Left message to call back  

## 2019-08-01 ENCOUNTER — Ambulatory Visit: Payer: Self-pay

## 2019-08-05 ENCOUNTER — Ambulatory Visit: Payer: Self-pay

## 2019-08-06 ENCOUNTER — Ambulatory Visit: Payer: Self-pay

## 2021-05-06 DIAGNOSIS — Z77098 Contact with and (suspected) exposure to other hazardous, chiefly nonmedicinal, chemicals: Secondary | ICD-10-CM | POA: Diagnosis not present

## 2021-10-13 ENCOUNTER — Telehealth: Payer: Self-pay

## 2021-10-13 NOTE — Telephone Encounter (Signed)
Copied from [place] [id number]. Topic: Appointment Scheduling - Scheduling Inquiry for Clinic >> Oct 13, 2021  2:04 PM Scherrie Gerlach wrote: Reason for CRM: pt's mother sees Dr Rosanna Randy. She would like to know if he will accept this pt.

## 2021-10-14 NOTE — Telephone Encounter (Signed)
Please advise 

## 2021-10-14 NOTE — Telephone Encounter (Signed)
I am not her doctor. Sorry,I am not taking new patients.

## 2021-10-18 NOTE — Telephone Encounter (Signed)
Pts mom called and was advised and stated that he can see Ria Comment as his PCP as well as his brother / Juluis Rainier

## 2021-10-19 NOTE — Telephone Encounter (Signed)
Patient's mother wanted her son Oscar Clark to also be with Dr Caryn Section (mother is Inez Catalina)

## 2022-01-05 ENCOUNTER — Encounter: Payer: Self-pay | Admitting: Physician Assistant

## 2022-01-05 ENCOUNTER — Ambulatory Visit (INDEPENDENT_AMBULATORY_CARE_PROVIDER_SITE_OTHER): Payer: BC Managed Care – PPO | Admitting: Physician Assistant

## 2022-01-05 VITALS — BP 186/118 | HR 89 | Temp 97.6°F | Resp 16 | Ht 69.0 in | Wt 178.5 lb

## 2022-01-05 DIAGNOSIS — N2889 Other specified disorders of kidney and ureter: Secondary | ICD-10-CM

## 2022-01-05 DIAGNOSIS — N529 Male erectile dysfunction, unspecified: Secondary | ICD-10-CM | POA: Diagnosis not present

## 2022-01-05 DIAGNOSIS — R000 Tachycardia, unspecified: Secondary | ICD-10-CM | POA: Diagnosis not present

## 2022-01-05 DIAGNOSIS — I10 Essential (primary) hypertension: Secondary | ICD-10-CM

## 2022-01-05 DIAGNOSIS — I160 Hypertensive urgency: Secondary | ICD-10-CM

## 2022-01-05 MED ORDER — AMLODIPINE BESYLATE 10 MG PO TABS: 10.0000 mg | ORAL_TABLET | Freq: Every day | ORAL | 0 refills | Status: DC

## 2022-01-05 MED ORDER — LISINOPRIL-HYDROCHLOROTHIAZIDE 10-12.5 MG PO TABS: 1.0000 | ORAL_TABLET | Freq: Every day | ORAL | 3 refills | Status: DC

## 2022-01-05 MED ORDER — CLONIDINE HCL 0.1 MG PO TABS: 0.1000 mg | ORAL_TABLET | Freq: Two times a day (BID) | ORAL | 0 refills | Status: DC

## 2022-01-05 NOTE — Assessment & Plan Note (Signed)
Was out of his meds for a month. ?BP was 197/117, second reading -186/118 ?In office, on Bystolic 10 mg/ a sample was provided ?- EKG 12-Lead Sinus rhythm. Rate 84, frequent PACs, Right bundle branch block ?- cloNIDine (CATAPRES) 0.1 MG tablet; Take 1 tablet (0.1 mg total) by mouth 2 (two) times daily.  Dispense: 14 tablet; Refill: 0 ? ?AMA patient refused to go to ER, prefers to go home and take his newly Rxed BP medications ? ?Work note was provided. Patient claimed that he has been feeling well. ?He is planning to rest today and during the weekend. ?He had a night shift of 14 hours prior to this appointment. ?I will scheduled a fu appt with him for Monday ?

## 2022-01-05 NOTE — Progress Notes (Signed)
? ?New Patient Office Visit ? ?Subjective   ? ?Patient ID: Oscar Clark, male    DOB: 11/06/1960  Age: 61 y.o. MRN: [id number] ? ?CC:  ?Chief Complaint  ?Patient presents with  ? Establish Care  ? ? ?HPI ?Oscar Clark presents to re-establish care and management of hypertension.  He was out of his medication for almost a month, denies having chest pain, shortness of breath, episodes of palpitation ? ?Has history of SVT in 2020, S/P Chemical cardioversion with IV adenosine. Was seen in ED. CT chest was negative for dissection, no acute EKG changes was found for MI, had a cardiology consultation. ? 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 No FU records available ? ?Also reports low sex drive and difficult to maintain erection. Reports having trouble for last 3-4 years.   ? ?Outpatient Encounter Medications as of 01/05/2022  ?Medication Sig  ? amLODipine (NORVASC) 10 MG tablet Take 1 tablet (10 mg total) by mouth daily.  ? aspirin EC 81 MG EC tablet Take 1 tablet (81 mg total) by mouth daily.  ? cloNIDine (CATAPRES) 0.1 MG tablet Take 1 tablet (0.1 mg total) by mouth 2 (two) times daily.  ? [DISCONTINUED] amLODipine (NORVASC) 5 MG tablet Take 5 mg by mouth daily.  ? [DISCONTINUED] lisinopril-hydrochlorothiazide (PRINZIDE,ZESTORETIC) 10-12.5 MG tablet TAKE ONE TABLET BY MOUTH ONCE DAILY  ? cyclobenzaprine (FLEXERIL) 5 MG tablet Take 1 tablet (5 mg total) by mouth 3 (three) times daily as needed for muscle spasms. (Patient not taking: Reported on 01/05/2022)  ? labetalol (NORMODYNE) 100 MG tablet Take 100 mg by mouth 2 (two) times daily. (Patient not taking: Reported on 01/05/2022)  ? lisinopril-hydrochlorothiazide (ZESTORETIC) 10-12.5 MG tablet Take 1 tablet by mouth daily.  ? ?No facility-administered encounter medications on file as of 01/05/2022.  ? ? ?Past Medical History:  ?Diagnosis Date  ? Hypertension   ? ? ?Past Surgical History:  ?Procedure  Laterality Date  ? KIDNEY STONE SURGERY    ? ? ?History reviewed. No pertinent family history. ? ?Social History  ? ?Socioeconomic History  ? Marital status: Single  ?  Spouse name: Not on file  ? Number of children: Not on file  ? Years of education: Not on file  ? Highest education level: Not on file  ?Occupational History  ? Not on file  ?Tobacco Use  ? Smoking status: Every Day  ?  Packs/day: 0.50  ?  Years: 18.00  ?  Pack years: 9.00  ?  Types: Cigarettes  ? Smokeless tobacco: Never  ?Substance and Sexual Activity  ? Alcohol use: Yes  ?  Alcohol/week: 0.0 standard drinks  ?  Comment: OCCASIONALLY  ? Drug use: No  ? Sexual activity: Yes  ?Other Topics Concern  ? Not on file  ?Social History Narrative  ? Not on file  ? ?Social Determinants of Health  ? ?Financial Resource Strain: Not on file  ?Food Insecurity: Not on file  ?Transportation Needs: Not on file  ?Physical Activity: Not on file  ?Stress: Not on file  ?Social Connections: Not on file  ?Intimate Partner Violence: Not on file  ? ? ?Review of Systems  ?Constitutional: Negative.   ?Respiratory: Negative.    ?Cardiovascular: Negative.   ?Neurological: Negative.   ?All other systems reviewed and are negative. ? ? ?Objective   ? ?BP (!) 186/118 (BP Location: Left Arm, Patient Position: Sitting)   Pulse 89  Temp 97.6 ?F (36.4 ?C) (Oral)   Resp 16   Ht 5\' 9"  (1.753 m)   Wt 178 lb 8 oz (81 kg)   SpO2 99%   BMI 26.36 kg/m?  ? ?Physical Exam ?Vitals reviewed.  ?Constitutional:   ?   General: He is not in acute distress. ?   Appearance: Normal appearance. He is not diaphoretic.  ?HENT:  ?   Head: Normocephalic and atraumatic.  ?Eyes:  ?   General: No scleral icterus. ?   Conjunctiva/sclera: Conjunctivae normal.  ?Cardiovascular:  ?   Rate and Rhythm: Tachycardia present. Rhythm irregular.  ?   Pulses: Normal pulses.  ?   Heart sounds: Normal heart sounds. No murmur heard. ?Pulmonary:  ?   Effort: Pulmonary effort is normal. No respiratory distress.  ?    Breath sounds: Normal breath sounds. No wheezing or rhonchi.  ?Abdominal:  ?   General: There is no distension.  ?   Palpations: Abdomen is soft.  ?   Tenderness: There is no abdominal tenderness.  ?Musculoskeletal:  ?   Cervical back: Neck supple.  ?   Right lower leg: No edema.  ?   Left lower leg: No edema.  ?Lymphadenopathy:  ?   Cervical: No cervical adenopathy.  ?Skin: ?   General: Skin is warm and dry.  ?   Capillary Refill: Capillary refill takes less than 2 seconds.  ?   Findings: No rash.  ?Neurological:  ?   Mental Status: He is alert and oriented to person, place, and time.  ?   Cranial Nerves: No cranial nerve deficit.  ?Psychiatric:     ?   Mood and Affect: Mood normal.     ?   Behavior: Behavior normal.  ? ? ? ?  ? ?Assessment & Plan:  ? ?Problem List Items Addressed This Visit   ? ?  ? Cardiovascular and Mediastinum  ? BP (high blood pressure)  ? Relevant Medications  ? lisinopril-hydrochlorothiazide (ZESTORETIC) 10-12.5 MG tablet  ? amLODipine (NORVASC) 10 MG tablet  ? cloNIDine (CATAPRES) 0.1 MG tablet  ? SVT (supraventricular tachycardia) (HCC)  ? Relevant Medications  ? lisinopril-hydrochlorothiazide (ZESTORETIC) 10-12.5 MG tablet  ? amLODipine (NORVASC) 10 MG tablet  ? cloNIDine (CATAPRES) 0.1 MG tablet  ? ?Other Visit Diagnoses   ? ? Essential hypertension      ? Relevant Medications  ? lisinopril-hydrochlorothiazide (ZESTORETIC) 10-12.5 MG tablet  ? amLODipine (NORVASC) 10 MG tablet  ? cloNIDine (CATAPRES) 0.1 MG tablet  ? ?  ?1. Tachycardia ?R bundle branch block  ?- EKG 12-Lead : Sinus rhythm. Rate 84, frequent PACs, Right bundle branch block ?- Bystolic sample was provided. ? ?2. Asymptomatic hypertensive urgency ?Was out of his meds for a month. ?- EKG 12-Lead Sinus rhythm. Rate 84, frequent PACs, Right bundle branch block ?- cloNIDine (CATAPRES) 0.1 MG tablet; Take 1 tablet (0.1 mg total) by mouth 2 (two) times daily.  Dispense: 14 tablet; Refill: 0 ? ? ?3. Essential hypertension ?Was out  of his meds for a month. ?- lisinopril-hydrochlorothiazide (ZESTORETIC) 10-12.5 MG tablet; Take 1 tablet by mouth daily.  Dispense: 30 tablet; Refill: 3 ?- amLODipine (NORVASC) 10 MG tablet; Take 1 tablet (10 mg total) by mouth daily.  Dispense: 30 tablet; Refill: 0 ? ? ?AMA patient refused to go to ER, prefers to go home and take his newly Rxed BP medications ? ?Work note was provided. Patient claimed that he has been feeling well. ?He is planning to rest  today and during the weekend. ?I will scheduled a fu appt with him for Monday ? ?4. Renal mass ?Will  refer to Urology if patient complies ? ?5. Erectile dysfunction ?Will address at his next appt. ? ?The patient was advised to go to ER or seek an in-person evaluation if the symptoms worsen or if the condition fails to improve as anticipated. ? ?I discussed the assessment and treatment plan with the patient. The patient was provided an opportunity to ask questions and all were answered. The patient agreed with the plan and demonstrated an understanding of the instructions. ? ?The entirety of the information documented in the History of Present Illness, Review of Systems and Physical Exam were personally obtained by me. Portions of this information were initially documented by the CMA and reviewed by me for thoroughness and accuracy.  ?Portions of this note were created using dictation software and may contain typographical errors.  ?  ?  ?Total encounter time more than 30 minutes ?Greater than 50% was spent in counseling and coordination of care with the patient  ? ?No follow-ups on file.  ? ?Debera LatJanna Bralyn Espino, PA-C ? ? ?

## 2022-01-05 NOTE — Assessment & Plan Note (Signed)
Was out of his BP meds for a month. BP today 186/118 second reading ?- lisinopril-hydrochlorothiazide (ZESTORETIC) 10-12.5 MG tablet; Take 1 tablet by mouth daily.  Dispense: 30 tablet; Refill: 3 ?- amLODipine (NORVASC) 10 MG tablet; Take 1 tablet (10 mg total) by mouth daily.  Dispense: 30 tablet; Refill: 0 ?Work note was provided  ?Will schedule a FU on Monday ?

## 2022-01-05 NOTE — Assessment & Plan Note (Signed)
Ordered EKG/sinus rhythm with frequent PACs, right bundle branch block. Start on Bystolic/sample was provided. ?Will reassess in 4-7 days ?

## 2022-01-07 DIAGNOSIS — N2889 Other specified disorders of kidney and ureter: Secondary | ICD-10-CM | POA: Insufficient documentation

## 2022-01-07 DIAGNOSIS — N529 Male erectile dysfunction, unspecified: Secondary | ICD-10-CM | POA: Insufficient documentation

## 2022-01-10 ENCOUNTER — Ambulatory Visit: Payer: Self-pay | Admitting: *Deleted

## 2022-01-10 NOTE — Telephone Encounter (Signed)
Pt mother stated pt cannot understand anything PCP says.  ?Pt mother stated PCP gave pt medication that he cannot take.  Pt mother mentions medication "Codeine" she stated PCP gave her medication that he has stopped taking because he isn't feeling well.  ?Pt mother stated pt is going into work and putting his head down and oversleeping.  ? ? ? ?

## 2022-01-10 NOTE — Telephone Encounter (Signed)
Summary: Medication Management .  ? Pt mother stated pt cannot understand anything PCP says.  ?Pt mother stated PCP gave pt medication that he cannot take.  Pt mother mentions medication "Codeine" she stated PCP gave her medication that he has stopped taking because he isn't feeling well.  ?Pt mother stated pt is going into work and putting his head down and oversleeping.  ? ? ?Please call pt [phone]   ?  ? ? ?2nd call attempted to reach patient regarding not feeling well and falling asleep at work. No answer, recording noted , call can not be completed at this time , try again later.  ?

## 2022-01-10 NOTE — Telephone Encounter (Signed)
3rd call attempted to review sx. No answer, able to leave VMTCB #[phone]. ?

## 2022-01-11 NOTE — Telephone Encounter (Signed)
Tried returning call. No answer. This is the 4th attempt.  ?

## 2022-01-12 ENCOUNTER — Encounter: Payer: Self-pay | Admitting: Emergency Medicine

## 2022-01-12 ENCOUNTER — Emergency Department: Payer: BC Managed Care – PPO

## 2022-01-12 ENCOUNTER — Other Ambulatory Visit: Payer: Self-pay

## 2022-01-12 ENCOUNTER — Observation Stay: Payer: BC Managed Care – PPO

## 2022-01-12 ENCOUNTER — Observation Stay
Admission: EM | Admit: 2022-01-12 | Discharge: 2022-01-13 | Disposition: A | Discharge status: Home / Self Care | Payer: BC Managed Care – PPO | Attending: Internal Medicine | Admitting: Internal Medicine

## 2022-01-12 ENCOUNTER — Observation Stay
Admit: 2022-01-12 | Discharge: 2022-01-12 | Disposition: A | Discharge status: Home / Self Care | Payer: BC Managed Care – PPO | Attending: Cardiology | Admitting: Cardiology

## 2022-01-12 ENCOUNTER — Ambulatory Visit: Payer: BC Managed Care – PPO | Admitting: Physician Assistant

## 2022-01-12 DIAGNOSIS — Z20822 Contact with and (suspected) exposure to covid-19: Secondary | ICD-10-CM | POA: Insufficient documentation

## 2022-01-12 DIAGNOSIS — M25511 Pain in right shoulder: Secondary | ICD-10-CM | POA: Diagnosis not present

## 2022-01-12 DIAGNOSIS — Z79899 Other long term (current) drug therapy: Secondary | ICD-10-CM | POA: Diagnosis not present

## 2022-01-12 DIAGNOSIS — H538 Other visual disturbances: Secondary | ICD-10-CM | POA: Diagnosis not present

## 2022-01-12 DIAGNOSIS — Z7982 Long term (current) use of aspirin: Secondary | ICD-10-CM | POA: Diagnosis not present

## 2022-01-12 DIAGNOSIS — R29818 Other symptoms and signs involving the nervous system: Secondary | ICD-10-CM | POA: Diagnosis not present

## 2022-01-12 DIAGNOSIS — J9809 Other diseases of bronchus, not elsewhere classified: Secondary | ICD-10-CM | POA: Diagnosis not present

## 2022-01-12 DIAGNOSIS — I209 Angina pectoris, unspecified: Secondary | ICD-10-CM | POA: Diagnosis not present

## 2022-01-12 DIAGNOSIS — R200 Anesthesia of skin: Secondary | ICD-10-CM | POA: Diagnosis not present

## 2022-01-12 DIAGNOSIS — I129 Hypertensive chronic kidney disease with stage 1 through stage 4 chronic kidney disease, or unspecified chronic kidney disease: Secondary | ICD-10-CM | POA: Insufficient documentation

## 2022-01-12 DIAGNOSIS — F17210 Nicotine dependence, cigarettes, uncomplicated: Secondary | ICD-10-CM | POA: Insufficient documentation

## 2022-01-12 DIAGNOSIS — M25519 Pain in unspecified shoulder: Secondary | ICD-10-CM | POA: Diagnosis not present

## 2022-01-12 DIAGNOSIS — R778 Other specified abnormalities of plasma proteins: Secondary | ICD-10-CM | POA: Diagnosis not present

## 2022-01-12 DIAGNOSIS — N182 Chronic kidney disease, stage 2 (mild): Secondary | ICD-10-CM | POA: Insufficient documentation

## 2022-01-12 DIAGNOSIS — I10 Essential (primary) hypertension: Secondary | ICD-10-CM | POA: Diagnosis not present

## 2022-01-12 DIAGNOSIS — R079 Chest pain, unspecified: Principal | ICD-10-CM | POA: Insufficient documentation

## 2022-01-12 DIAGNOSIS — I200 Unstable angina: Secondary | ICD-10-CM | POA: Diagnosis not present

## 2022-01-12 DIAGNOSIS — I639 Cerebral infarction, unspecified: Secondary | ICD-10-CM | POA: Insufficient documentation

## 2022-01-12 DIAGNOSIS — I2510 Atherosclerotic heart disease of native coronary artery without angina pectoris: Secondary | ICD-10-CM | POA: Insufficient documentation

## 2022-01-12 DIAGNOSIS — I160 Hypertensive urgency: Secondary | ICD-10-CM | POA: Diagnosis not present

## 2022-01-12 DIAGNOSIS — N179 Acute kidney failure, unspecified: Secondary | ICD-10-CM | POA: Diagnosis not present

## 2022-01-12 DIAGNOSIS — N3289 Other specified disorders of bladder: Secondary | ICD-10-CM | POA: Diagnosis not present

## 2022-01-12 DIAGNOSIS — R42 Dizziness and giddiness: Secondary | ICD-10-CM | POA: Diagnosis not present

## 2022-01-12 DIAGNOSIS — R0789 Other chest pain: Secondary | ICD-10-CM | POA: Diagnosis not present

## 2022-01-12 DIAGNOSIS — Z720 Tobacco use: Secondary | ICD-10-CM | POA: Diagnosis present

## 2022-01-12 LAB — HIV ANTIBODY (ROUTINE TESTING W REFLEX): HIV Screen 4th Generation wRfx: NONREACTIVE

## 2022-01-12 LAB — NM MYOCAR MULTI W/SPECT W/WALL MOTION / EF
Estimated workload: 1
Exercise duration (sec): 59 s
LV dias vol: 103 mL (ref 62–150)
LV sys vol: 65 mL
MPHR: 159 {beats}/min
Nuc Stress EF: 37 %
Peak HR: 94 {beats}/min
Percent HR: 59 %
Rest HR: 65 {beats}/min
Rest Nuclear Isotope Dose: 10.1 mCi
SDS: 0
SRS: 7
SSS: 5
ST Depression (mm): 0 mm
Stress Nuclear Isotope Dose: 30.5 mCi
TID: 1.06

## 2022-01-12 LAB — HEPATIC FUNCTION PANEL
ALT: 63 U/L — ABNORMAL HIGH (ref 0–44)
AST: 51 U/L — ABNORMAL HIGH (ref 15–41)
Albumin: 4.5 g/dL (ref 3.5–5.0)
Alkaline Phosphatase: 81 U/L (ref 38–126)
Bilirubin, Direct: 0.1 mg/dL (ref 0.0–0.2)
Indirect Bilirubin: 0.6 mg/dL (ref 0.3–0.9)
Total Bilirubin: 0.7 mg/dL (ref 0.3–1.2)
Total Protein: 8.4 g/dL — ABNORMAL HIGH (ref 6.5–8.1)

## 2022-01-12 LAB — BASIC METABOLIC PANEL
Anion gap: 8 (ref 5–15)
BUN: 47 mg/dL — ABNORMAL HIGH (ref 8–23)
CO2: 17 mmol/L — ABNORMAL LOW (ref 22–32)
Calcium: 9.6 mg/dL (ref 8.9–10.3)
Chloride: 111 mmol/L (ref 98–111)
Creatinine, Ser: 1.72 mg/dL — ABNORMAL HIGH (ref 0.61–1.24)
GFR, Estimated: 45 mL/min — ABNORMAL LOW (ref >60)
Glucose, Bld: 113 mg/dL — ABNORMAL HIGH (ref 70–99)
Potassium: 4.8 mmol/L (ref 3.5–5.1)
Sodium: 136 mmol/L (ref 135–145)

## 2022-01-12 LAB — HEMOGLOBIN A1C
Hgb A1c MFr Bld: 6.3 % — ABNORMAL HIGH (ref 4.8–5.6)
Mean Plasma Glucose: 134.11 mg/dL

## 2022-01-12 LAB — RESP PANEL BY RT-PCR (FLU A&B, COVID) ARPGX2
Influenza A by PCR: NEGATIVE
Influenza B by PCR: NEGATIVE
SARS Coronavirus 2 by RT PCR: NEGATIVE

## 2022-01-12 LAB — CBC
HCT: 51.3 % (ref 39.0–52.0)
Hemoglobin: 17 g/dL (ref 13.0–17.0)
MCH: 28.3 pg (ref 26.0–34.0)
MCHC: 33.1 g/dL (ref 30.0–36.0)
MCV: 85.4 fL (ref 80.0–100.0)
Platelets: 286 10*3/uL (ref 150–400)
RBC: 6.01 MIL/uL — ABNORMAL HIGH (ref 4.22–5.81)
RDW: 13.5 % (ref 11.5–15.5)
WBC: 7.7 10*3/uL (ref 4.0–10.5)
nRBC: 0 % (ref 0.0–0.2)

## 2022-01-12 LAB — ECHOCARDIOGRAM COMPLETE
AR max vel: 3.56 cm2
AV Area VTI: 3.64 cm2
AV Area mean vel: 3.39 cm2
AV Mean grad: 3 mmHg
AV Peak grad: 5.6 mmHg
Ao pk vel: 1.18 m/s
Area-P 1/2: 2.78 cm2
Height: 69 in
MV VTI: 3.44 cm2
S' Lateral: 3.5 cm
Weight: 2848 oz

## 2022-01-12 LAB — TROPONIN I (HIGH SENSITIVITY)
Troponin I (High Sensitivity): 14 ng/L (ref <18)
Troponin I (High Sensitivity): 21 ng/L — ABNORMAL HIGH (ref <18)
Troponin I (High Sensitivity): 34 ng/L — ABNORMAL HIGH (ref <18)
Troponin I (High Sensitivity): 31 ng/L — ABNORMAL HIGH (ref <18)

## 2022-01-12 LAB — PROTIME-INR
INR: 1 (ref 0.8–1.2)
Prothrombin Time: 13.2 seconds (ref 11.4–15.2)

## 2022-01-12 LAB — CBG MONITORING, ED: Glucose-Capillary: 126 mg/dL — ABNORMAL HIGH (ref 70–99)

## 2022-01-12 LAB — CK: Total CK: 175 U/L (ref 49–397)

## 2022-01-12 LAB — LIPASE, BLOOD: Lipase: 39 U/L (ref 11–51)

## 2022-01-12 MED ORDER — ALBUTEROL SULFATE (2.5 MG/3ML) 0.083% IN NEBU: 3.0000 mL | INHALATION_SOLUTION | RESPIRATORY_TRACT | Status: DC | PRN

## 2022-01-12 MED ORDER — NITROGLYCERIN 0.4 MG SL SUBL
0.4000 mg | SUBLINGUAL_TABLET | SUBLINGUAL | Status: DC | PRN
Start: 2022-01-12 — End: 2022-01-13

## 2022-01-12 MED ORDER — ENOXAPARIN SODIUM 40 MG/0.4ML IJ SOSY
40.0000 mg | PREFILLED_SYRINGE | INTRAMUSCULAR | Status: DC
  Administered 2022-01-12: 40 mg via SUBCUTANEOUS
  Filled 2022-01-12: qty 0.4

## 2022-01-12 MED ORDER — SODIUM CHLORIDE 0.9 % IV BOLUS
500.0000 mL | Freq: Once | INTRAVENOUS | Status: AC
  Administered 2022-01-12: 500 mL via INTRAVENOUS

## 2022-01-12 MED ORDER — NITROGLYCERIN 2 % TD OINT
1.0000 [in_us] | TOPICAL_OINTMENT | Freq: Once | TRANSDERMAL | Status: AC
  Administered 2022-01-12: 1 [in_us] via TOPICAL
  Filled 2022-01-12: qty 1

## 2022-01-12 MED ORDER — TECHNETIUM TC 99M TETROFOSMIN IV KIT
30.4500 | PACK | Freq: Once | INTRAVENOUS | Status: AC | PRN
  Administered 2022-01-12: 30.45 via INTRAVENOUS

## 2022-01-12 MED ORDER — DM-GUAIFENESIN ER 30-600 MG PO TB12: 1.0000 | ORAL_TABLET | Freq: Two times a day (BID) | ORAL | Status: DC | PRN

## 2022-01-12 MED ORDER — HYDRALAZINE HCL 20 MG/ML IJ SOLN
5.0000 mg | INTRAMUSCULAR | Status: DC | PRN
  Administered 2022-01-12 – 2022-01-13 (×2): 5 mg via INTRAVENOUS
  Filled 2022-01-12 (×2): qty 1

## 2022-01-12 MED ORDER — MORPHINE SULFATE (PF) 2 MG/ML IV SOLN: 2.0000 mg | INTRAVENOUS | Status: DC | PRN

## 2022-01-12 MED ORDER — MORPHINE SULFATE (PF) 4 MG/ML IV SOLN
4.0000 mg | Freq: Once | INTRAVENOUS | Status: AC
  Administered 2022-01-12: 4 mg via INTRAVENOUS
  Filled 2022-01-12: qty 1

## 2022-01-12 MED ORDER — SODIUM CHLORIDE 0.9 % IV SOLN: INTRAVENOUS | Status: DC

## 2022-01-12 MED ORDER — AMLODIPINE BESYLATE 5 MG PO TABS
10.0000 mg | ORAL_TABLET | Freq: Every day | ORAL | Status: DC
  Administered 2022-01-12 – 2022-01-13 (×2): 10 mg via ORAL
  Filled 2022-01-12 (×2): qty 2

## 2022-01-12 MED ORDER — IOHEXOL 350 MG/ML SOLN
100.0000 mL | Freq: Once | INTRAVENOUS | Status: AC | PRN
  Administered 2022-01-12: 100 mL via INTRAVENOUS

## 2022-01-12 MED ORDER — ASPIRIN 81 MG PO TBEC
81.0000 mg | DELAYED_RELEASE_TABLET | Freq: Every day | ORAL | Status: DC
  Administered 2022-01-13: 81 mg via ORAL
  Filled 2022-01-12: qty 1

## 2022-01-12 MED ORDER — ONDANSETRON HCL 4 MG/2ML IJ SOLN
4.0000 mg | Freq: Three times a day (TID) | INTRAMUSCULAR | Status: DC | PRN
Start: 2022-01-12 — End: 2022-01-13

## 2022-01-12 MED ORDER — TECHNETIUM TC 99M TETROFOSMIN IV KIT
10.0500 | PACK | Freq: Once | INTRAVENOUS | Status: AC | PRN
  Administered 2022-01-12: 10.05 via INTRAVENOUS

## 2022-01-12 MED ORDER — ASPIRIN 81 MG PO CHEW
324.0000 mg | CHEWABLE_TABLET | Freq: Once | ORAL | Status: AC
  Administered 2022-01-12: 324 mg via ORAL
  Filled 2022-01-12: qty 4

## 2022-01-12 MED ORDER — ACETAMINOPHEN 325 MG PO TABS
650.0000 mg | ORAL_TABLET | Freq: Four times a day (QID) | ORAL | Status: DC | PRN
Start: 2022-01-12 — End: 2022-01-13

## 2022-01-12 MED ORDER — NICOTINE 21 MG/24HR TD PT24: 21.0000 mg | MEDICATED_PATCH | Freq: Every day | TRANSDERMAL | Status: DC

## 2022-01-12 MED ORDER — REGADENOSON 0.4 MG/5ML IV SOLN
0.4000 mg | Freq: Once | INTRAVENOUS | Status: AC
  Administered 2022-01-12: 0.4 mg via INTRAVENOUS

## 2022-01-12 MED ORDER — ATORVASTATIN CALCIUM 20 MG PO TABS
40.0000 mg | ORAL_TABLET | Freq: Every day | ORAL | Status: DC
  Administered 2022-01-12 – 2022-01-13 (×2): 40 mg via ORAL
  Filled 2022-01-12 (×2): qty 2

## 2022-01-12 NOTE — H&P (Addendum)
History and Physical    Rachid Hlavaty WKG:[id number] DOB: 03-19-1961 DOA: 01/12/2022  Referring MD/NP/PA:   PCP: [name], PA-C   Patient coming from:  The patient is coming from home.  At baseline, pt is independent for most of ADL.        Chief Complaint: chest pain  HPI: Oscar Clark is a 61 y.o. male with medical history significant of hypertension, SVT, tobacco abuse, CAD type II, kidney stone, who presents with chest pain.  Patient states that his chest pain started at about 11 PM last night, which is located in the right side of chest, initially 10 out of 10 in severity, currently chest pain-free, pressure-like, radiating to the right arm.  Patient denies shortness breath, cough, fever or chills.  He states that he has intermittent blurry vision and right arm numbness in the past several days.  He has nausea, no vomiting, diarrhea or abdominal pain.  No symptoms of UTI. Patient is not taking blood pressure medications due to side effects of being drowsy and sleepy. His Bp is 179/105 in ED. patient states that he drinks beer only in the weekend, approximately 6 beers in the weekend.   Data Reviewed and ED Course: pt was found to have troponin level 14, 21, CK level 175, worsening renal function, abnormal liver function (ALP 81, AST 51, ALT 63, total bilirubin 0.6), temperature normal, heart rate 53, RR 20, oxygen saturation 97% on room air.  Chest x-ray negative.  CT angiograms negative for aortic dissection or PE, but showed left lower lobe groundglass opacity.  Patient is placed on telemetry bed for observation, Dr. Juliann Pares of cardiology is consulted   EKG: I have personally reviewed.  Sinus rhythm, QTc 429, right bundle blockade, early R wave progression, PVC   Review of Systems:   General: no fevers, chills, no body weight gain, has fatigue HEENT: no blurry vision, hearing changes or sore throat Respiratory: no dyspnea, coughing, wheezing CV: has chest pain, no  palpitations GI: has nausea, no vomiting, abdominal pain, diarrhea, constipation GU: no dysuria, burning on urination, increased urinary frequency, hematuria  Ext: no leg edema Neuro: Has intermittent blurry vision and right arm numbness Skin: no rash, no skin tear. MSK: No muscle spasm, no deformity, no limitation of range of movement in spin Heme: No easy bruising.  Travel history: No recent long distant travel.   Allergy: No Known Allergies  Past Medical History:  Diagnosis Date   Hypertension     Past Surgical History:  Procedure Laterality Date   KIDNEY STONE SURGERY      Social History:  reports that he has been smoking cigarettes. He has a 9.00 pack-year smoking history. He has never used smokeless tobacco. He reports current alcohol use. He reports that he does not use drugs.  Family History:  Family History  Problem Relation Age of Onset   Hypertension Mother    Hypertension Brother      Prior to Admission medications   Medication Sig Start Date End Date Taking? Authorizing Provider  acetaminophen (TYLENOL) 500 MG tablet Take 1,000 mg by mouth every 8 (eight) hours as needed for headache.   Yes [provider]  aspirin EC 81 MG EC tablet Take 1 tablet (81 mg total) by mouth daily. 03/17/19  Yes Enedina Finner, MD  lisinopril-hydrochlorothiazide (ZESTORETIC) 10-12.5 MG tablet Take 1 tablet by mouth daily. 01/05/22  Yes Ostwalt, Edmon Crape, PA-C  amLODipine (NORVASC) 10 MG tablet Take 1 tablet (10 mg total) by mouth  daily. Patient not taking: Reported on 01/12/2022 01/05/22   Mardene Speak, PA-C  cloNIDine (CATAPRES) 0.1 MG tablet Take 1 tablet (0.1 mg total) by mouth 2 (two) times daily. Patient not taking: Reported on 01/12/2022 01/05/22   Mardene Speak, PA-C    Physical Exam: Vitals:   01/12/22 1000 01/12/22 1030 01/12/22 1130 01/12/22 1443  BP: (!) 159/101 (!) 143/100 (!) 153/89 (!) 142/92  Pulse: (!) 56 (!) 54 61 60  Resp: 20 18 20 19   Temp:    98 F (36.7  C)  TempSrc:    Oral  SpO2: 98% 97% 96% 100%  Weight:      Height:       General: Not in acute distress HEENT:       Eyes: PERRL, EOMI, no scleral icterus.       ENT: No discharge from the ears and nose, no pharynx injection, no tonsillar enlargement.        Neck: No JVD, no bruit, no mass felt. Heme: No neck lymph node enlargement. Cardiac: S1/S2, RRR, No murmurs, No gallops or rubs. Respiratory: No rales, wheezing, rhonchi or rubs. GI: Soft, nondistended, nontender, no rebound pain, no organomegaly, BS present. GU: No hematuria Ext: No pitting leg edema bilaterally. 1+DP/PT pulse bilaterally. Musculoskeletal: No joint deformities, No joint redness or warmth, no limitation of ROM in spin. Skin: No rashes.  Neuro: Alert, oriented X3, cranial nerves II-XII grossly intact, moves all extremities normally. Muscle strength 5/5 in all extremities, sensation to light touch intact. B Psych: Patient is not psychotic, no suicidal or hemocidal ideation.  Labs on Admission: I have personally reviewed following labs and imaging studies  CBC: Recent Labs  Lab 01/12/22 0149  WBC 7.7  HGB 17.0  HCT 51.3  MCV 85.4  PLT [id number]   Basic Metabolic Panel: Recent Labs  Lab 01/12/22 0149  NA 136  K 4.8  CL 111  CO2 17*  GLUCOSE 113*  BUN 47*  CREATININE 1.72*  CALCIUM 9.6   GFR: Estimated Creatinine Clearance: 45.1 mL/min (A) (by C-G formula based on SCr of 1.72 mg/dL (H)). Liver Function Tests: Recent Labs  Lab 01/12/22 0149  AST 51*  ALT 63*  ALKPHOS 81  BILITOT 0.7  PROT 8.4*  ALBUMIN 4.5   Recent Labs  Lab 01/12/22 0149  LIPASE 39   No results for input(s): AMMONIA in the last 168 hours. Coagulation Profile: Recent Labs  Lab 01/12/22 0744  INR 1.0   Cardiac Enzymes: Recent Labs  Lab 01/12/22 0149  CKTOTAL 175   BNP (last 3 results) No results for input(s): PROBNP in the last 8760 hours. HbA1C: Recent Labs    01/12/22 0744  HGBA1C 6.3*   CBG: Recent  Labs  Lab 01/12/22 0756  GLUCAP 126*   Lipid Profile: No results for input(s): CHOL, HDL, LDLCALC, TRIG, CHOLHDL, LDLDIRECT in the last 72 hours. Thyroid Function Tests: No results for input(s): TSH, T4TOTAL, FREET4, T3FREE, THYROIDAB in the last 72 hours. Anemia Panel: No results for input(s): VITAMINB12, FOLATE, FERRITIN, TIBC, IRON, RETICCTPCT in the last 72 hours. Urine analysis:    Component Value Date/Time   BILIRUBINUR negative 08/01/2016 1340   PROTEINUR trace 08/01/2016 1340   UROBILINOGEN 0.2 08/01/2016 1340   NITRITE negative 08/01/2016 1340   LEUKOCYTESUR Negative 08/01/2016 1340   Sepsis Labs: @LABRCNTIP (procalcitonin:4,lacticidven:4) ) Recent Results (from the past 240 hour(s))  Resp Panel by RT-PCR (Flu A&B, Covid) Nasopharyngeal Swab     Status: None   Collection Time: 01/12/22  7:44 AM   Specimen: Nasopharyngeal Swab; Nasopharyngeal(NP) swabs in vial transport medium  Result Value Ref Range Status   SARS Coronavirus 2 by RT PCR NEGATIVE NEGATIVE Final    Comment: (NOTE) SARS-CoV-2 target nucleic acids are NOT DETECTED.  The SARS-CoV-2 RNA is generally detectable in upper respiratory specimens during the acute phase of infection. The lowest concentration of SARS-CoV-2 viral copies this assay can detect is 138 copies/mL. A negative result does not preclude SARS-Cov-2 infection and should not be used as the sole basis for treatment or other patient management decisions. A negative result may occur with  improper specimen collection/handling, submission of specimen other than nasopharyngeal swab, presence of viral mutation(s) within the areas targeted by this assay, and inadequate number of viral copies(<138 copies/mL). A negative result must be combined with clinical observations, patient history, and epidemiological information. The expected result is Negative.  Fact Sheet for Patients:  EntrepreneurPulse.com.au  Fact Sheet for  Healthcare Providers:  IncredibleEmployment.be  This test is no t yet approved or cleared by the [place] FDA and  has been authorized for detection and/or diagnosis of SARS-CoV-2 by FDA under an Emergency Use Authorization (EUA). This EUA will remain  in effect (meaning this test can be used) for the duration of the COVID-19 declaration under Section 564(b)(1) of the Act, 21 U.S.C.section 360bbb-3(b)(1), unless the authorization is terminated  or revoked sooner.       Influenza A by PCR NEGATIVE NEGATIVE Final   Influenza B by PCR NEGATIVE NEGATIVE Final    Comment: (NOTE) The Xpert Xpress SARS-CoV-2/FLU/RSV plus assay is intended as an aid in the diagnosis of influenza from Nasopharyngeal swab specimens and should not be used as a sole basis for treatment. Nasal washings and aspirates are unacceptable for Xpert Xpress SARS-CoV-2/FLU/RSV testing.  Fact Sheet for Patients: EntrepreneurPulse.com.au  Fact Sheet for Healthcare Providers: IncredibleEmployment.be  This test is not yet approved or cleared by the [place] FDA and has been authorized for detection and/or diagnosis of SARS-CoV-2 by FDA under an Emergency Use Authorization (EUA). This EUA will remain in effect (meaning this test can be used) for the duration of the COVID-19 declaration under Section 564(b)(1) of the Act, 21 U.S.C. section 360bbb-3(b)(1), unless the authorization is terminated or revoked.  Performed at [facility], [address] [place], [place] [address]      Radiological Exams on Admission: DG Chest 2 View  Result Date: 01/12/2022 CLINICAL DATA:  Right-sided chest pain EXAM: CHEST - 2 VIEW COMPARISON:  03/16/2019 FINDINGS: Cardiac shadow is within normal limits. The lungs are well aerated bilaterally. No focal infiltrate or sizable effusion is seen. No acute bony abnormality is noted. IMPRESSION: No acute abnormality noted.  Electronically Signed   By: Inez Catalina M.D.   On: 01/12/2022 02:05   NM Myocar Multi W/Spect W/Wall Motion / EF  Result Date: 01/12/2022   Findings are equivocal. The study is intermediate risk.   No ST deviation was noted.   LV perfusion is equivocal.   Left ventricular function is abnormal. Nuclear stress EF: 37 %. The left ventricular ejection fraction is moderately decreased (30-44%). End diastolic cavity size is normal.   Prior study not available for comparison. Abnormal myocardial perfusion scan There is poor tracer uptake at stress and rest Persistent defects basally inferiorly no clear evidence of reversibility Moderately depressed left ventricular function around 37% globally There is no clear evidence of reversible ischemia but this study does not rule out coronary disease or  infarct Study may be consistent with an ischemic cardiomyopathy We will consider invasive evaluation or cardiac CT   ECHOCARDIOGRAM COMPLETE  Result Date: 01/12/2022    ECHOCARDIOGRAM REPORT   Patient Name:   JASPAR MELLS Date of Exam: 01/12/2022 Medical Rec #:  [id number]     Height:       69.0 in Accession #:    [id number]    Weight:       178.0 lb Date of Birth:  1960/11/05     BSA:          1.966 m Patient Age:    17 years      BP:           153/89 mmHg Patient Gender: M             HR:           67 bpm. Exam Location:  [facility] Procedure: 2D Echo, Color Doppler and Cardiac Doppler Indications:     R07.9 Chest Pain  History:         Patient has prior history of Echocardiogram examinations. Risk                  Factors:Hypertension.  Sonographer:     Charmayne Sheer Referring Phys:  [id number] [place] TANG Diagnosing Phys: Yolonda Kida MD IMPRESSIONS  1. Left ventricular ejection fraction, by estimation, is 55 to 60%. The left ventricle has normal function. The left ventricle has no regional wall motion abnormalities. Left ventricular diastolic parameters were normal.  2. Right ventricular systolic function is normal. The  right ventricular size is normal.  3. The mitral valve is normal in structure. Trivial mitral valve regurgitation.  4. The aortic valve is normal in structure. Aortic valve regurgitation is not visualized. FINDINGS  Left Ventricle: Left ventricular ejection fraction, by estimation, is 55 to 60%. The left ventricle has normal function. The left ventricle has no regional wall motion abnormalities. The left ventricular internal cavity size was normal in size. There is  no left ventricular hypertrophy. Left ventricular diastolic parameters were normal. Right Ventricle: The right ventricular size is normal. No increase in right ventricular wall thickness. Right ventricular systolic function is normal. Left Atrium: Left atrial size was normal in size. Right Atrium: Right atrial size was normal in size. Pericardium: There is no evidence of pericardial effusion. Mitral Valve: The mitral valve is normal in structure. Trivial mitral valve regurgitation. MV peak gradient, 1.9 mmHg. The mean mitral valve gradient is 1.0 mmHg. Tricuspid Valve: The tricuspid valve is normal in structure. Tricuspid valve regurgitation is mild. Aortic Valve: The aortic valve is normal in structure. Aortic valve regurgitation is not visualized. Aortic valve mean gradient measures 3.0 mmHg. Aortic valve peak gradient measures 5.6 mmHg. Aortic valve area, by VTI measures 3.64 cm. Pulmonic Valve: The pulmonic valve was normal in structure. Pulmonic valve regurgitation is not visualized. Aorta: The ascending aorta was not well visualized. IAS/Shunts: No atrial level shunt detected by color flow Doppler.  LEFT VENTRICLE PLAX 2D LVIDd:         5.20 cm   Diastology LVIDs:         3.50 cm   LV e' medial:    4.90 cm/s LV PW:         1.25 cm   LV E/e' medial:  11.0 LV IVS:        0.93 cm   LV e' lateral:   4.35 cm/s LVOT diam:  2.30 cm   LV E/e' lateral: 12.4 LV SV:         70 LV SV Index:   36 LVOT Area:     4.15 cm  RIGHT VENTRICLE RV Basal diam:  3.09  cm LEFT ATRIUM             Index        RIGHT ATRIUM           Index LA diam:        3.90 cm 1.98 cm/m   RA Area:     13.40 cm LA Vol (A2C):   38.2 ml 19.43 ml/m  RA Volume:   34.10 ml  17.34 ml/m LA Vol (A4C):   48.4 ml 24.62 ml/m LA Biplane Vol: 46.1 ml 23.45 ml/m  AORTIC VALVE                    PULMONIC VALVE AV Area (Vmax):    3.56 cm     PV Vmax:       0.72 m/s AV Area (Vmean):   3.39 cm     PV Vmean:      48.300 cm/s AV Area (VTI):     3.64 cm     PV VTI:        0.099 m AV Vmax:           118.00 cm/s  PV Peak grad:  2.1 mmHg AV Vmean:          83.800 cm/s  PV Mean grad:  1.0 mmHg AV VTI:            0.193 m AV Peak Grad:      5.6 mmHg AV Mean Grad:      3.0 mmHg LVOT Vmax:         101.00 cm/s LVOT Vmean:        68.300 cm/s LVOT VTI:          0.169 m LVOT/AV VTI ratio: 0.88  AORTA Ao Root diam: 3.90 cm MITRAL VALVE MV Area (PHT): 2.78 cm    SHUNTS MV Area VTI:   3.44 cm    Systemic VTI:  0.17 m MV Peak grad:  1.9 mmHg    Systemic Diam: 2.30 cm MV Mean grad:  1.0 mmHg MV Vmax:       0.69 m/s MV Vmean:      37.3 cm/s MV Decel Time: 273 msec MV E velocity: 54.00 cm/s MV A velocity: 51.80 cm/s MV E/A ratio:  1.04 Yolonda Kida MD Electronically signed by Yolonda Kida MD Signature Date/Time: 01/12/2022/4:12:58 PM    Final    CT Angio Chest/Abd/Pel for Dissection W and/or W/WO  Result Date: 01/12/2022 CLINICAL DATA:  61 year old male with chest and back pain. Right chest pain radiating to the sternum. Right arm numbness. Blurred vision, dizziness. SVT. Abnormal troponin. EXAM: CT ANGIOGRAPHY CHEST, ABDOMEN AND PELVIS TECHNIQUE: Non-contrast CT of the chest was initially obtained. Multidetector CT imaging through the chest, abdomen and pelvis was performed using the standard protocol during bolus administration of intravenous contrast. Multiplanar reconstructed images and MIPs were obtained and reviewed to evaluate the vascular anatomy. RADIATION DOSE REDUCTION: This exam was performed according  to the departmental dose-optimization program which includes automated exposure control, adjustment of the mA and/or kV according to patient size and/or use of iterative reconstruction technique. CONTRAST:  164mL OMNIPAQUE IOHEXOL 350 MG/ML SOLN COMPARISON:  CTA Chest, Abdomen, and Pelvis 03/16/2019. Chest radiographs 0144 hours today.  FINDINGS: CTA CHEST FINDINGS Cardiovascular: Stable cardiac size at the upper limits of normal. No pericardial effusion. No definite calcified coronary artery atherosclerosis. Stable mild tortuosity of the thoracic aorta since 2020. Negative for thoracic aortic aneurysm or dissection. Proximal great vessels are patent. Central pulmonary arteries also appear patent. No pulmonary embolism identified. Mediastinum/Nodes: Negative. No mediastinal mass or lymphadenopathy. Lungs/Pleura: Major airways are patent although with bilateral perihilar generalized bronchial wall thickening. Confluent ground-glass opacity in multiple segments of the left lower lobe, similar to the left lower lobe appearance in 2020, although the right lower lobe today is largely clear. There is minor dependent ground-glass opacity on the right. No consolidation at this time. No pleural effusion. Some underlying centrilobular emphysema which is most apparent in the upper lobes. Musculoskeletal: Chronic thoracic disc and endplate degeneration. Chronic exaggerated upper thoracic kyphosis. Lower cervical disc and endplate degeneration. No acute osseous abnormality identified. Review of the MIP images confirms the above findings. CTA ABDOMEN AND PELVIS FINDINGS VASCULAR Patent abdominal aorta and its branches with little to no atherosclerosis. Accessory right renal artery (normal variant). No abdominal aortic aneurysm or dissection. Patent and mildly tortuous iliac arteries. Patent proximal femoral arteries. No significant visible iliofemoral atherosclerosis. Review of the MIP images confirms the above findings.  NON-VASCULAR Hepatobiliary: Negative liver aside from questionable steatosis. Negative gallbladder. Pancreas: Negative. Spleen: Negative. Adrenals/Urinary Tract: Normal adrenal glands. Kidneys appears stable since 2020 and within normal limits. No nephrolithiasis or pararenal inflammation. Decompressed ureters. Chronic right posterior bladder diverticulum (series 5, image 270) was present in 2020 but is more conspicuous today, measuring 2.2 cm. Underlying urinary bladder distension (400 mL), no perivesical inflammation. Stomach/Bowel: No dilated large bowel. Mildly redundant colon with no active inflammation. Normal appendix on series 5, image 225. The cecum is on a lax mesentery. Negative terminal ileum. No dilated small bowel. Decompressed stomach and duodenum. No free air, free fluid, or mesenteric inflammation identified. Lymphatic: No lymphadenopathy identified. Reproductive: Negative. Other: No pelvic free fluid. Musculoskeletal: Chronic lower lumbar disc degeneration and facet degeneration. Vacuum disc and vacuum facet. Chronic benign appearing proximal right femur subchondral cyst appears stable. No acute osseous abnormality identified. Review of the MIP images confirms the above findings. IMPRESSION: 1. Negative for aortic aneurysm or dissection. No pulmonary embolism identified. 2. Left lower lobe ground-glass opacity, similar to a 2020 CTA finding and favored to reflect recurrent Pneumonia. Other differential considerations such as chronic organizing pneumonia felt less likely. Some underlying chronic lung disease with generalized bronchial wall thickening and Emphysema (ICD10-J43.9). No pleural effusion. 3. No acute or inflammatory process identified in the abdomen or pelvis. Normal appendix. 4. Bladder distension (400 mL) with a chronic right posterior bladder diverticulum. Query urinary retention. Electronically Signed   By: Genevie Ann M.D.   On: 01/12/2022 04:13      Assessment/Plan Principal  Problem:   Chest pain Active Problems:   Elevated troponin   Essential hypertension   Right arm numbness   Tobacco abuse   Acute renal failure superimposed on stage 2 chronic kidney disease (HCC)   Principal Problem:   Chest pain Active Problems:   Elevated troponin   Essential hypertension   Right arm numbness   Tobacco abuse   Acute renal failure superimposed on stage 2 chronic kidney disease (HCC)   Assessment and Plan: * Chest pain Chest pain and elevated trop: trop 14 -->  21 --. 34 --> 31. Consulted, [place].  Myoview was performed which is equivocal with poor tracer uptake.  2D  echo is okay per Dr. Clayborn Bigness.  - place to tele bed for observation - Trend Trop - prn Nitroglycerin, Morphine, and aspirin, lipitor  - Risk factor stratification: will check FLP and A1C  - check UDS      Elevated troponin See above  Acute renal failure superimposed on stage 2 chronic kidney disease ([place]) Baseline creatinine 1.01 on 03/16/2019.  His creatinine is 1.72, BUN 47. -IV fluid: 500 cc normal saline, followed by 75 cc/h -Hold Prinzide  Tobacco abuse Nicotine patch  Right arm numbness Patient reports intermittent slurred speech and right arm numbness, we need to rule out stroke. -Follow-up MRI for brain  Essential hypertension - IV hydralazine as needed -Amlodipine -Hold clonidine which is likely the reason for the side effects of being sleepy -Hold Prinzide due to worsening renal function             DVT ppx:  SQ Lovenox  Code Status: Full code  Family Communication: not done, no family member is at bed side.    Disposition Plan:  Anticipate discharge back to previous environment  Consults called:  Dr. Clayborn Bigness of card  Admission status and Level of care: Telemetry Cardiac:   for obs   Severity of Illness:  The appropriate patient status for this patient is OBSERVATION. Observation status is judged to be reasonable and necessary in order to  provide the required intensity of service to ensure the patient's safety. The patient's presenting symptoms, physical exam findings, and initial radiographic and laboratory data in the context of their medical condition is felt to place them at decreased risk for further clinical deterioration. Furthermore, it is anticipated that the patient will be medically stable for discharge from the hospital within 2 midnights of admission.        Date of Service 01/12/2022    [facility]   If 7PM-7AM, please contact night-coverage [email or web address] 01/12/2022, 6:03 PM

## 2022-01-12 NOTE — Assessment & Plan Note (Signed)
Patient reports intermittent slurred speech and right arm numbness, we need to rule out stroke. -Follow-up MRI for brain

## 2022-01-12 NOTE — ED Triage Notes (Signed)
Pt arrived via [facility] from work with right anterior chest pain that radiates into sternum with right arm numbness. Pt also c/o intermittent blurred vision, dizziness and nausea x1 week. Pt with hx/o SVT, elevated troponin and and hypertension.

## 2022-01-12 NOTE — ED Provider Notes (Signed)
[facility] Provider Note    Event Date/Time   First MD Initiated Contact with Patient 01/12/22 [phone]     (approximate)   History   Chest Pain and Nausea   HPI  Oscar Clark is a 61 y.o. male brought to the ED via EMS from work with a chief complaint of right chest and shoulder pain.  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  States his PCP started him on blood pressure medicine last week but he only took several days' worth and then self discontinued due to medicines making him feeling sleepy at work.  Told triage he has been experiencing intermittent blurry vision, dizziness and nausea for the past week.  Denies this to me.  Denies recent fever, cough, shortness of breath, abdominal pain, vomiting.  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 Authorizing Provider  acetaminophen (TYLENOL) 500 MG tablet Take 1,000 mg by mouth every 8 (eight) hours as needed for headache.   Yes [provider]  aspirin EC 81 MG EC tablet Take 1 tablet (81 mg total) by mouth daily. 03/17/19  Yes Fritzi Mandes, MD  lisinopril-hydrochlorothiazide (ZESTORETIC)  10-12.5 MG tablet Take 1 tablet by mouth daily. 01/05/22  Yes Ostwalt, Letitia Libra, PA-C  amLODipine (NORVASC) 10 MG tablet Take 1 tablet (10 mg total) by mouth daily. Patient not taking: Reported on 01/12/2022 01/05/22   Mardene Speak, PA-C  cloNIDine (CATAPRES) 0.1 MG tablet Take 1 tablet (0.1 mg total) by mouth 2 (two) times daily. Patient not taking: Reported on 01/12/2022 01/05/22   Mardene Speak, PA-C     Allergies  Patient has no known allergies.   Family History  History reviewed. No pertinent family history.   Physical Exam  Triage Vital Signs: ED Triage Vitals [01/12/22 0136]  Enc Vitals Group     BP (!) 169/106     Pulse Rate 67     Resp 17     Temp 97.9 F (36.6 C)     Temp Source Oral     SpO2 96 %     Weight 178 lb (80.7 kg)     Height 5\' 9"  (1.753 m)     Head Circumference      Peak Flow      Pain Score      Pain Loc      Pain Edu?      Excl. in [place]?     Updated Vital Signs: BP (!) 179/105   Pulse 66   Temp 97.9 F (36.6 C) (Oral)   Resp 13   Ht 5\' 9"  (1.753 m)  Wt 80.7 kg   SpO2 96%   BMI 26.29 kg/m    General: Awake, no distress.  CV:  RRR.  Good peripheral perfusion.  Resp:  Normal effort.  CTA B. Abd:  Nontender.  No distention.  Other:  No carotid bruits.  Right upper lateral pectoralis muscle tender to palpation.  Full range of motion right shoulder without pain.  2+ radial pulse.  Brisk, less than 5-second capillary refill   ED Results / Procedures / Treatments  Labs (all labs ordered are listed, but only abnormal results are displayed) Labs Reviewed  BASIC METABOLIC PANEL - Abnormal; Notable for the following components:      Result Value   CO2 17 (*)    Glucose, Bld 113 (*)    BUN 47 (*)    Creatinine, Ser 1.72 (*)    GFR, Estimated 45 (*)    All other components within normal limits  CBC - Abnormal; Notable for the following components:   RBC 6.01 (*)    All other components within normal limits  HEPATIC FUNCTION PANEL -  Abnormal; Notable for the following components:   Total Protein 8.4 (*)    AST 51 (*)    ALT 63 (*)    All other components within normal limits  TROPONIN I (HIGH SENSITIVITY) - Abnormal; Notable for the following components:   Troponin I (High Sensitivity) 21 (*)    All other components within normal limits  CK  LIPASE, BLOOD  TROPONIN I (HIGH SENSITIVITY)     EKG  ED ECG REPORT I, Kerrie Timm J, the attending physician, personally viewed and interpreted this ECG.   Date: 01/12/2022  EKG Time: 0139  Rate: 70  Rhythm: normal sinus rhythm  Axis: Normal  Intervals:right bundle branch block  ST&T Change: Nonspecific    RADIOLOGY I have independently visualized and interpreted patient's chest x-ray and CT scan as well as noted the radiology interpretation:  Chest x-ray: No acute cardiopulmonary process  CTA chest/abdomen/pelvis: No PE or dissection;?  Left lower lobe recurrent pneumonia  Official radiology report(s): DG Chest 2 View  Result Date: 01/12/2022 CLINICAL DATA:  Right-sided chest pain EXAM: CHEST - 2 VIEW COMPARISON:  03/16/2019 FINDINGS: Cardiac shadow is within normal limits. The lungs are well aerated bilaterally. No focal infiltrate or sizable effusion is seen. No acute bony abnormality is noted. IMPRESSION: No acute abnormality noted. Electronically Signed   By: Inez Catalina M.D.   On: 01/12/2022 02:05   CT Angio Chest/Abd/Pel for Dissection W and/or W/WO  Result Date: 01/12/2022 CLINICAL DATA:  61 year old male with chest and back pain. Right chest pain radiating to the sternum. Right arm numbness. Blurred vision, dizziness. SVT. Abnormal troponin. EXAM: CT ANGIOGRAPHY CHEST, ABDOMEN AND PELVIS TECHNIQUE: Non-contrast CT of the chest was initially obtained. Multidetector CT imaging through the chest, abdomen and pelvis was performed using the standard protocol during bolus administration of intravenous contrast. Multiplanar reconstructed images and MIPs were  obtained and reviewed to evaluate the vascular anatomy. RADIATION DOSE REDUCTION: This exam was performed according to the departmental dose-optimization program which includes automated exposure control, adjustment of the mA and/or kV according to patient size and/or use of iterative reconstruction technique. CONTRAST:  160mL OMNIPAQUE IOHEXOL 350 MG/ML SOLN COMPARISON:  CTA Chest, Abdomen, and Pelvis 03/16/2019. Chest radiographs 0144 hours today. FINDINGS: CTA CHEST FINDINGS Cardiovascular: Stable cardiac size at the upper limits of normal. No pericardial effusion. No definite calcified coronary artery atherosclerosis. Stable mild tortuosity of the thoracic aorta  since 2020. Negative for thoracic aortic aneurysm or dissection. Proximal great vessels are patent. Central pulmonary arteries also appear patent. No pulmonary embolism identified. Mediastinum/Nodes: Negative. No mediastinal mass or lymphadenopathy. Lungs/Pleura: Major airways are patent although with bilateral perihilar generalized bronchial wall thickening. Confluent ground-glass opacity in multiple segments of the left lower lobe, similar to the left lower lobe appearance in 2020, although the right lower lobe today is largely clear. There is minor dependent ground-glass opacity on the right. No consolidation at this time. No pleural effusion. Some underlying centrilobular emphysema which is most apparent in the upper lobes. Musculoskeletal: Chronic thoracic disc and endplate degeneration. Chronic exaggerated upper thoracic kyphosis. Lower cervical disc and endplate degeneration. No acute osseous abnormality identified. Review of the MIP images confirms the above findings. CTA ABDOMEN AND PELVIS FINDINGS VASCULAR Patent abdominal aorta and its branches with little to no atherosclerosis. Accessory right renal artery (normal variant). No abdominal aortic aneurysm or dissection. Patent and mildly tortuous iliac arteries. Patent proximal femoral arteries.  No significant visible iliofemoral atherosclerosis. Review of the MIP images confirms the above findings. NON-VASCULAR Hepatobiliary: Negative liver aside from questionable steatosis. Negative gallbladder. Pancreas: Negative. Spleen: Negative. Adrenals/Urinary Tract: Normal adrenal glands. Kidneys appears stable since 2020 and within normal limits. No nephrolithiasis or pararenal inflammation. Decompressed ureters. Chronic right posterior bladder diverticulum (series 5, image 270) was present in 2020 but is more conspicuous today, measuring 2.2 cm. Underlying urinary bladder distension (400 mL), no perivesical inflammation. Stomach/Bowel: No dilated large bowel. Mildly redundant colon with no active inflammation. Normal appendix on series 5, image 225. The cecum is on a lax mesentery. Negative terminal ileum. No dilated small bowel. Decompressed stomach and duodenum. No free air, free fluid, or mesenteric inflammation identified. Lymphatic: No lymphadenopathy identified. Reproductive: Negative. Other: No pelvic free fluid. Musculoskeletal: Chronic lower lumbar disc degeneration and facet degeneration. Vacuum disc and vacuum facet. Chronic benign appearing proximal right femur subchondral cyst appears stable. No acute osseous abnormality identified. Review of the MIP images confirms the above findings. IMPRESSION: 1. Negative for aortic aneurysm or dissection. No pulmonary embolism identified. 2. Left lower lobe ground-glass opacity, similar to a 2020 CTA finding and favored to reflect recurrent Pneumonia. Other differential considerations such as chronic organizing pneumonia felt less likely. Some underlying chronic lung disease with generalized bronchial wall thickening and Emphysema (ICD10-J43.9). No pleural effusion. 3. No acute or inflammatory process identified in the abdomen or pelvis. Normal appendix. 4. Bladder distension (400 mL) with a chronic right posterior bladder diverticulum. Query urinary retention.  Electronically Signed   By: Odessa Fleming M.D.   On: 01/12/2022 04:13     PROCEDURES:  Critical Care performed: Yes, see critical care procedure note(s)  CRITICAL CARE Performed by: Irean Hong   Total critical care time: 30 minutes  Critical care time was exclusive of separately billable procedures and treating other patients.  Critical care was necessary to treat or prevent imminent or life-threatening deterioration.  Critical care was time spent personally by me on the following activities: development of treatment plan with patient and/or surrogate as well as nursing, discussions with consultants, evaluation of patient's response to treatment, examination of patient, obtaining history from patient or surrogate, ordering and performing treatments and interventions, ordering and review of laboratory studies, ordering and review of radiographic studies, pulse oximetry and re-evaluation of patient's condition.   Marland Kitchen1-3 Lead EKG Interpretation Performed by: Irean Hong, MD Authorized by: Irean Hong, MD     Interpretation: normal  ECG rate:  70   ECG rate assessment: normal     Rhythm: sinus rhythm     Ectopy: none     Conduction: normal   Comments:     Patient placed on cardiac monitor to evaluate for arrhythmias   MEDICATIONS ORDERED IN ED: Medications  sodium chloride 0.9 % bolus 500 mL (0 mLs Intravenous Stopped 01/12/22 0445)  iohexol (OMNIPAQUE) 350 MG/ML injection 100 mL (100 mLs Intravenous Contrast Given 01/12/22 0351)  aspirin chewable tablet 324 mg (324 mg Oral Given 01/12/22 0557)  nitroGLYCERIN (NITROGLYN) 2 % ointment 1 inch (1 inch Topical Given 01/12/22 0557)  morphine (PF) 4 MG/ML injection 4 mg (4 mg Intravenous Given 01/12/22 0557)     IMPRESSION / MDM / [place] / ED COURSE  I reviewed the triage vital signs and the nursing notes.                             61 year old male presenting with right upper chest pain with right arm numbness.  I have  identified that this patient may have a potentially life-threatening condition. Differential diagnosis includes, but is not limited to, ACS, aortic dissection, pulmonary embolism, cardiac tamponade, pneumothorax, pneumonia, pericarditis, myocarditis, GI-related causes including esophagitis/gastritis, and musculoskeletal chest wall pain.     I have personally reviewed patient's records and see that he had a PCP office visit on 01/05/2022.  It was documented that he had been out of his clonidine, Zestoretic and Norvasc x1 month and these medications were restarted.  The patient is on the cardiac monitor to evaluate for evidence of arrhythmia and/or significant heart rate changes.  Laboratory results demonstrate normal WBC 7.7, AKI BUN 47/creatinine 1.72, initial troponin negative.  Chest x-ray unremarkable.  Will check CK, repeat time troponin, add LFTs/lipase.  Initiate IV fluid hydration; obtain CTA dissection protocol.  Will reassess.  Patient voicing no complaints or concerns at this time, states symptoms have resolved.  Clinical Course as of 01/12/22 0647  Thu Jan 12, 2022  0523 Updated patient of negative CTA.  Denies fever or cough; will hold antibiotics.  Slight bump in troponin.  Patient continues to have pain.  Will administer aspirin, nitroglycerin paste for blood pressure control and morphine.  Will consult hospital services for admission [JS]    Clinical Course User Index [JS] Paulette Blanch, MD     FINAL CLINICAL IMPRESSION(S) / ED DIAGNOSES   Final diagnoses:  Hypertension, unspecified type  Chest pain, unspecified type     Rx / DC Orders   ED Discharge Orders     None        Note:  This document was prepared using Dragon voice recognition software and may include unintentional dictation errors.   Paulette Blanch, MD 01/12/22 [phone]

## 2022-01-12 NOTE — Assessment & Plan Note (Signed)
Chest pain and elevated trop: trop 14 -->  21 --. 34 --> 31. Consulted, [place].  Myoview was performed which is equivocal with poor tracer uptake.  2D echo is okay per Dr. Clayborn Bigness.  - place to tele bed for observation - Trend Trop - prn Nitroglycerin, Morphine, and aspirin, lipitor  - Risk factor stratification: will check FLP and A1C  - check UDS

## 2022-01-12 NOTE — Assessment & Plan Note (Signed)
See above

## 2022-01-12 NOTE — Assessment & Plan Note (Signed)
-   IV hydralazine as needed -Amlodipine -Hold clonidine which is likely the reason for the side effects of being sleepy -Hold Prinzide due to worsening renal function

## 2022-01-12 NOTE — ED Notes (Signed)
Pt going to MRI

## 2022-01-12 NOTE — ED Notes (Signed)
Niu, MD at bedside. °

## 2022-01-12 NOTE — Consult Note (Signed)
[place] NOTE       Patient ID: Oscar Clark MRN: [id number] DOB/AGE: December 20, 1960 61 y.o.  Admit date: 01/12/2022 Referring Physician Dr. Ivor Costa Primary Physician Mardene Speak, PA-C Primary Cardiologist Dr. 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  Cardiology is consulted for further assistance.  He was seen by his PCP on 01/05/2022 to reestablish care after being out of his lisinopril-HCTZ 10-12.35m for a month.  He was restarted on lisinopril-HCTZ and also was newly prescribed amlodipine 10 mg, clonidine 0.1 mg twice daily, and nebivolol but apparently he only took several days worth then stopped taking all of the medications on his own because they were making him sleepy and nauseous at work..  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  At my time of evaluation the patient is comfortable in the ED stretcher, states he was having right-sided chest/shoulder pain with associated shortness of breath and diaphoresis last evening which prompted his presentation.  He also states his right chest just underneath his armpit is tender to palpation.  He has had this discomfort on and off for the past 4 to 6 weeks.  Sometimes takes Tylenol which he thinks makes the pain worse.  It is hard to assess whether his symptoms are exertional but he says he has to lay down and rest because it hurts so bad in his right shoulder and chest area.  He describes the feeling like a "knot" in feels like he has to mash on the area to get it to resolve.  He admits to occasional palpitations and dizziness but has not happened in a number of years.   Denies orthopnea, lower extremity edema.  He previously saw Dr. 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  An echo and nuclear stress test were ordered but the results are unavailable for my review.  He had an echo done in 2020 when he had SVT which showed a preserved EF of 50-55% with normal diastolic parameters, mild thickening and calcification of the aortic valve without other abnormalities.  He currently smokes 1 pack/week and has done so for the past 35 years.  He drinks beer on the weekends occasionally.  Labs are notable for a potassium of 4.8, BUN/creatinine 47/1.72, EGFR 45.  High-sensitivity troponin minimally elevated and flat trending at 14-21-34.  CT angio negative for aortic aneurysm, dissection, pulmonary embolism with some underlying chronic lung disease with generalized bronchial wall thickening and emphysema without pleural effusion.  Chest x-ray without acute abnormality  Blood pressure during interview remains very elevated at 189/110, saturating well on room air.  Heart rate in the 60s and normal sinus rhythm on telemetry.    Review of systems complete and found to be negative unless listed above     Past Medical History:  Diagnosis Date   Hypertension     Past Surgical History:  Procedure Laterality Date   KIDNEY STONE SURGERY      (Not in a hospital admission)  Social History   Socioeconomic History   Marital status: Single    Spouse  name: Not on file   Number of children: Not on file   Years of education: Not on file   Highest education level: Not on file  Occupational History   Not on file  Tobacco Use   Smoking status: Every Day    Packs/day: 0.50    Years: 18.00    Pack years: 9.00    Types: Cigarettes   Smokeless tobacco: Never  Substance and Sexual Activity   Alcohol use: Yes    Alcohol/week: 0.0 standard drinks    Comment: OCCASIONALLY   Drug use:  No   Sexual activity: Yes  Other Topics Concern   Not on file  Social History Narrative   Not on file   Social Determinants of Health   Financial Resource Strain: Not on file  Food Insecurity: Not on file  Transportation Needs: Not on file  Physical Activity: Not on file  Stress: Not on file  Social Connections: Not on file  Intimate Partner Violence: Not on file    History reviewed. No pertinent family history.    PHYSICAL EXAM General: middle-aged black male, well nourished, in no acute distress.  Sitting upright in ED stretcher. HEENT:  Normocephalic and atraumatic. Neck:  No JVD.  Lungs: Normal respiratory effort on room air.  Expiratory wheezes throughout. heart: HRRR with some premature beats. Normal S1 and S2 without gallops or murmurs. Radial & DP pulses 2+ bilaterally. Chest: Mild tenderness to palpation of the right pectoralis muscle Abdomen: Obese appearing.  Msk: Normal strength and tone for age. Extremities: Warm and well perfused. No clubbing, cyanosis.  No peripheral edema.  Neuro: Alert and oriented X 3. Psych:  Answers questions appropriately.   Labs:   Lab Results  Component Value Date   WBC 7.7 01/12/2022   HGB 17.0 01/12/2022   HCT 51.3 01/12/2022   MCV 85.4 01/12/2022   PLT 286 01/12/2022    Recent Labs  Lab 01/12/22 0149  NA 136  K 4.8  CL 111  CO2 17*  BUN 47*  CREATININE 1.72*  CALCIUM 9.6  PROT 8.4*  BILITOT 0.7  ALKPHOS 81  ALT 63*  AST 51*  GLUCOSE 113*   Lab Results  Component Value Date   CKTOTAL 175 01/12/2022    Lab Results  Component Value Date   CHOL 166 03/16/2019   CHOL 202 (H) 06/11/2015   Lab Results  Component Value Date   HDL 43 03/16/2019   HDL 40 06/11/2015   Lab Results  Component Value Date   LDLCALC 112 (H) 03/16/2019   LDLCALC 121 (H) 06/11/2015   Lab Results  Component Value Date   TRIG 53 03/16/2019   TRIG 206 (H) 06/11/2015   Lab Results  Component Value Date   CHOLHDL 3.9 03/16/2019    CHOLHDL 5.1 (H) 06/11/2015   No results found for: LDLDIRECT    Radiology: DG Chest 2 View  Result Date: 01/12/2022 CLINICAL DATA:  Right-sided chest pain EXAM: CHEST - 2 VIEW COMPARISON:  03/16/2019 FINDINGS: Cardiac shadow is within normal limits. The lungs are well aerated bilaterally. No focal infiltrate or sizable effusion is seen. No acute bony abnormality is noted. IMPRESSION: No acute abnormality noted. Electronically Signed   By: Inez Catalina M.D.   On: 01/12/2022 02:05   CT Angio Chest/Abd/Pel for Dissection W and/or W/WO  Result Date: 01/12/2022 CLINICAL DATA:  61 year old male with chest and back pain. Right chest pain radiating to the sternum. Right arm numbness. Blurred vision, dizziness. SVT. Abnormal  troponin. EXAM: CT ANGIOGRAPHY CHEST, ABDOMEN AND PELVIS TECHNIQUE: Non-contrast CT of the chest was initially obtained. Multidetector CT imaging through the chest, abdomen and pelvis was performed using the standard protocol during bolus administration of intravenous contrast. Multiplanar reconstructed images and MIPs were obtained and reviewed to evaluate the vascular anatomy. RADIATION DOSE REDUCTION: This exam was performed according to the departmental dose-optimization program which includes automated exposure control, adjustment of the mA and/or kV according to patient size and/or use of iterative reconstruction technique. CONTRAST:  141m OMNIPAQUE IOHEXOL 350 MG/ML SOLN COMPARISON:  CTA Chest, Abdomen, and Pelvis 03/16/2019. Chest radiographs 0144 hours today. FINDINGS: CTA CHEST FINDINGS Cardiovascular: Stable cardiac size at the upper limits of normal. No pericardial effusion. No definite calcified coronary artery atherosclerosis. Stable mild tortuosity of the thoracic aorta since 2020. Negative for thoracic aortic aneurysm or dissection. Proximal great vessels are patent. Central pulmonary arteries also appear patent. No pulmonary embolism identified. Mediastinum/Nodes: Negative.  No mediastinal mass or lymphadenopathy. Lungs/Pleura: Major airways are patent although with bilateral perihilar generalized bronchial wall thickening. Confluent ground-glass opacity in multiple segments of the left lower lobe, similar to the left lower lobe appearance in 2020, although the right lower lobe today is largely clear. There is minor dependent ground-glass opacity on the right. No consolidation at this time. No pleural effusion. Some underlying centrilobular emphysema which is most apparent in the upper lobes. Musculoskeletal: Chronic thoracic disc and endplate degeneration. Chronic exaggerated upper thoracic kyphosis. Lower cervical disc and endplate degeneration. No acute osseous abnormality identified. Review of the MIP images confirms the above findings. CTA ABDOMEN AND PELVIS FINDINGS VASCULAR Patent abdominal aorta and its branches with little to no atherosclerosis. Accessory right renal artery (normal variant). No abdominal aortic aneurysm or dissection. Patent and mildly tortuous iliac arteries. Patent proximal femoral arteries. No significant visible iliofemoral atherosclerosis. Review of the MIP images confirms the above findings. NON-VASCULAR Hepatobiliary: Negative liver aside from questionable steatosis. Negative gallbladder. Pancreas: Negative. Spleen: Negative. Adrenals/Urinary Tract: Normal adrenal glands. Kidneys appears stable since 2020 and within normal limits. No nephrolithiasis or pararenal inflammation. Decompressed ureters. Chronic right posterior bladder diverticulum (series 5, image 270) was present in 2020 but is more conspicuous today, measuring 2.2 cm. Underlying urinary bladder distension (400 mL), no perivesical inflammation. Stomach/Bowel: No dilated large bowel. Mildly redundant colon with no active inflammation. Normal appendix on series 5, image 225. The cecum is on a lax mesentery. Negative terminal ileum. No dilated small bowel. Decompressed stomach and duodenum. No  free air, free fluid, or mesenteric inflammation identified. Lymphatic: No lymphadenopathy identified. Reproductive: Negative. Other: No pelvic free fluid. Musculoskeletal: Chronic lower lumbar disc degeneration and facet degeneration. Vacuum disc and vacuum facet. Chronic benign appearing proximal right femur subchondral cyst appears stable. No acute osseous abnormality identified. Review of the MIP images confirms the above findings. IMPRESSION: 1. Negative for aortic aneurysm or dissection. No pulmonary embolism identified. 2. Left lower lobe ground-glass opacity, similar to a 2020 CTA finding and favored to reflect recurrent Pneumonia. Other differential considerations such as chronic organizing pneumonia felt less likely. Some underlying chronic lung disease with generalized bronchial wall thickening and Emphysema (ICD10-J43.9). No pleural effusion. 3. No acute or inflammatory process identified in the abdomen or pelvis. Normal appendix. 4. Bladder distension (400 mL) with a chronic right posterior bladder diverticulum. Query urinary retention. Electronically Signed   By: HGenevie AnnM.D.   On: 01/12/2022 04:13    ECHO 03/16/2019  1. The left ventricle has low normal systolic  function, with an ejection  fraction of 50-55%. The cavity size was normal. There is mildly increased  left ventricular wall thickness. Left ventricular diastolic parameters  were normal.   2. The right ventricle has normal systolic function. The cavity was  normal. There is no increase in right ventricular wall thickness.   3. The aortic valve is tricuspid. Mild thickening of the aortic valve.  Mild calcification of the aortic valve. Aortic valve regurgitation is  trivial by color flow Doppler.   4. The aortic root is normal in size and structure.   TELEMETRY reviewed by me: Sinus rhythm with PACs rate 55-70  EKG reviewed by me: Sinus rhythm PACs, RBBB  ASSESSMENT AND PLAN:  Everson Mott is a 61 year old male with a past  medical history of hypertension, tobacco abuse, SVT in 2020 s/p termination with adenosine who presented to [facility] ED 01/12/2022 with right-sided chest/shoulder pain and a severely elevated blood pressure.  Cardiology is consulted for further assistance.  #Chest pain #Hypertensive urgency Patient presents with a 4 to 6-week history of intermittent right-sided chest discomfort with some typical and atypical features.  He has pain in his right chest and shoulder that radiates down his right arm with associated shortness of breath, diaphoresis and numbness in his right arm.  He also has reproducible tenderness to palpation in the right pectoralis muscle.  Troponins are minimally elevated but flat trending.  He does have risk factors for coronary artery disease including poorly controlled hypertension and ongoing tobacco abuse, so further evaluation with an echo and functional study are recommended. -S/p 24 mg aspirin, continue 81 mg aspirin daily -Defer heparin drip -Continue home amlodipine 10 mg daily -We will add back lisinopril 10 mg daily -Likely initiate low-dose metoprolol XL, hold HCTZ, clonidine, Bystolic -As needed hydralazine every 2 hours for SBP greater than 160 -Risk factor modification with lipid panel, agree with atorvastatin 40 mg -Echocardiogram complete -Lexiscan stress test ordered, further recommendations based on results  #Tobacco abuse Currently smokes 1 pack/week and has done so for 35 years, smoking cessation discussed with patient in detail, he is somewhat motivated to quit.  This patient's plan of care was discussed and created with Dr. Clayborn Bigness and he is in agreement.  Signed: Tristan Schroeder , PA-C 01/12/2022, 8:25 AM [facility]

## 2022-01-12 NOTE — Progress Notes (Signed)
*  PRELIMINARY RESULTS* Echocardiogram 2D Echocardiogram has been performed.  Oscar Clark 01/12/2022, 3:55 PM

## 2022-01-12 NOTE — Assessment & Plan Note (Signed)
-  Nicotine patch 

## 2022-01-12 NOTE — Assessment & Plan Note (Signed)
Baseline creatinine 1.01 on 03/16/2019.  His creatinine is 1.72, BUN 47. -IV fluid: 500 cc normal saline, followed by 75 cc/h -Hold Prinzide

## 2022-01-12 NOTE — ED Notes (Signed)
Patient to NM scan

## 2022-01-12 NOTE — ED Notes (Signed)
Pt currently in MRI, report received by previous RN

## 2022-01-13 ENCOUNTER — Observation Stay: Payer: BC Managed Care – PPO

## 2022-01-13 DIAGNOSIS — R7989 Other specified abnormal findings of blood chemistry: Secondary | ICD-10-CM | POA: Diagnosis not present

## 2022-01-13 DIAGNOSIS — I639 Cerebral infarction, unspecified: Secondary | ICD-10-CM | POA: Diagnosis not present

## 2022-01-13 DIAGNOSIS — Z720 Tobacco use: Secondary | ICD-10-CM | POA: Diagnosis not present

## 2022-01-13 DIAGNOSIS — M5020 Other cervical disc displacement, unspecified cervical region: Secondary | ICD-10-CM | POA: Diagnosis not present

## 2022-01-13 DIAGNOSIS — I6782 Cerebral ischemia: Secondary | ICD-10-CM | POA: Diagnosis not present

## 2022-01-13 DIAGNOSIS — I63233 Cerebral infarction due to unspecified occlusion or stenosis of bilateral carotid arteries: Secondary | ICD-10-CM | POA: Diagnosis not present

## 2022-01-13 DIAGNOSIS — I160 Hypertensive urgency: Secondary | ICD-10-CM | POA: Diagnosis not present

## 2022-01-13 DIAGNOSIS — M47812 Spondylosis without myelopathy or radiculopathy, cervical region: Secondary | ICD-10-CM | POA: Diagnosis not present

## 2022-01-13 DIAGNOSIS — R079 Chest pain, unspecified: Secondary | ICD-10-CM | POA: Diagnosis not present

## 2022-01-13 DIAGNOSIS — J341 Cyst and mucocele of nose and nasal sinus: Secondary | ICD-10-CM | POA: Diagnosis not present

## 2022-01-13 LAB — URINE DRUG SCREEN, QUALITATIVE (ARMC ONLY)
Amphetamines, Ur Screen: NOT DETECTED
Barbiturates, Ur Screen: NOT DETECTED
Benzodiazepine, Ur Scrn: NOT DETECTED
Cannabinoid 50 Ng, Ur ~~LOC~~: NOT DETECTED
Cocaine Metabolite,Ur ~~LOC~~: NOT DETECTED
MDMA (Ecstasy)Ur Screen: NOT DETECTED
Methadone Scn, Ur: NOT DETECTED
Opiate, Ur Screen: POSITIVE — AB
Phencyclidine (PCP) Ur S: NOT DETECTED
Tricyclic, Ur Screen: NOT DETECTED

## 2022-01-13 LAB — LIPID PANEL
Cholesterol: 228 mg/dL — ABNORMAL HIGH (ref 0–200)
HDL: 38 mg/dL — ABNORMAL LOW (ref >40)
LDL Cholesterol: 128 mg/dL — ABNORMAL HIGH (ref 0–99)
Total CHOL/HDL Ratio: 6 RATIO
Triglycerides: 310 mg/dL — ABNORMAL HIGH (ref <150)
VLDL: 62 mg/dL — ABNORMAL HIGH (ref 0–40)

## 2022-01-13 LAB — BASIC METABOLIC PANEL
Anion gap: 8 (ref 5–15)
BUN: 25 mg/dL — ABNORMAL HIGH (ref 8–23)
CO2: 16 mmol/L — ABNORMAL LOW (ref 22–32)
Calcium: 9 mg/dL (ref 8.9–10.3)
Chloride: 111 mmol/L (ref 98–111)
Creatinine, Ser: 1.1 mg/dL (ref 0.61–1.24)
GFR, Estimated: >60 mL/min (ref >60)
Glucose, Bld: 105 mg/dL — ABNORMAL HIGH (ref 70–99)
Potassium: 4.2 mmol/L (ref 3.5–5.1)
Sodium: 135 mmol/L (ref 135–145)

## 2022-01-13 MED ORDER — ASPIRIN 81 MG PO TBEC: 81.0000 mg | DELAYED_RELEASE_TABLET | Freq: Every day | ORAL | 1 refills | Status: AC

## 2022-01-13 MED ORDER — AMLODIPINE BESYLATE 10 MG PO TABS: 10.0000 mg | ORAL_TABLET | Freq: Every day | ORAL | 1 refills | Status: DC

## 2022-01-13 MED ORDER — ATORVASTATIN CALCIUM 40 MG PO TABS: 40.0000 mg | ORAL_TABLET | Freq: Every day | ORAL | 1 refills | Status: DC

## 2022-01-13 MED ORDER — CLOPIDOGREL BISULFATE 75 MG PO TABS: 75.0000 mg | ORAL_TABLET | Freq: Every day | ORAL | 0 refills | Status: AC

## 2022-01-13 MED ORDER — IOHEXOL 350 MG/ML SOLN
75.0000 mL | Freq: Once | INTRAVENOUS | Status: AC | PRN
  Administered 2022-01-13: 75 mL via INTRAVENOUS

## 2022-01-13 NOTE — Discharge Summary (Signed)
Physician Discharge Summary  Oscar Clark [id number] DOB: 1960/09/10 DOA: 01/12/2022  PCP: Mardene Speak, PA-C  Admit date: 01/12/2022 Discharge date: 01/13/2022  Admitted From: Home Disposition:  Home  Recommendations for Outpatient Follow-up:  Follow up with PCP in 1-2 weeks Consider referral to outpatient neurology  Home Health:No Equipment/Devices:None   Discharge Condition:Stable  CODE STATUS:FULL  Diet recommendation: Heart healthy  Brief/Interim Summary: 61 y.o. male with medical history significant of hypertension, SVT, tobacco abuse, CAD type II, kidney stone, who presents with chest pain.   Patient states that his chest pain started at about 11 PM last night, which is located in the right side of chest, initially 10 out of 10 in severity, currently chest pain-free, pressure-like, radiating to the right arm.  Patient denies shortness breath, cough, fever or chills.  He states that he has intermittent blurry vision and right arm numbness in the past several days.  He has nausea, no vomiting, diarrhea or abdominal pain.  No symptoms of UTI. Patient is not taking blood pressure medications due to side effects of being drowsy and sleepy. His Bp is 179/105 in ED. patient states that he drinks beer only in the weekend, approximately 6 beers in the weekend.  Stress test completed.  Results negative.  Cleared for discharge from cardiology standpoint.  Patient did endorse intermittent slurred speech, arm numbness.  Follow-up MRI revealed small area of acute ischemia in the right medial temporal lobe.  Neurology consulted.  Felt to be be an incidental finding as symptoms of numbness were more consistent with muscle fatigue.  Neurology ordered CTA head and neck.  Recommending dual antiplatelet therapy aspirin and Plavix x3 weeks followed by aspirin monotherapy.  High intensity statin.  Resume home blood pressure regimen.  Stressed importance of adherence to recommended medical therapy.   Follow-up outpatient PCP.    Discharge Diagnoses:  Principal Problem:   Chest pain Active Problems:   Elevated troponin   Essential hypertension   Right arm numbness   Tobacco abuse   Acute renal failure superimposed on stage 2 chronic kidney disease (HCC)  Chest pain Minimally elevated troponin Equivocal Myoview.  Poor tracer uptake.  2D echocardiogram reassuring.  Cleared for discharge from cardiology standpoint.  Will discharge on high intensity statin, aspirin.  Will need Plavix also for 3 weeks.  Follow-up outpatient PCP.  CVA Patient reports intermittent slurred speech and right arm numbness.  MRI brain revealed small area of acute ischemia in the right medial temporal lobe.  This finding is felt to be incidental after neurology consultation.  CTA head and neck ordered.  Redemonstrates known small area of ischemia seen on MRI.  No large vessel occlusion.  Stable for discharge.  Recommend dual antiplatelet therapy x3 weeks followed by aspirin monotherapy alone.  Discharge Instructions   Allergies as of 01/13/2022   No Known Allergies      Medication List     STOP taking these medications    cloNIDine 0.1 MG tablet Commonly known as: CATAPRES       TAKE these medications    acetaminophen 500 MG tablet Commonly known as: TYLENOL Take 1,000 mg by mouth every 8 (eight) hours as needed for headache.   amLODipine 10 MG tablet Commonly known as: NORVASC Take 1 tablet (10 mg total) by mouth daily.   aspirin EC 81 MG tablet Take 1 tablet (81 mg total) by mouth daily.   atorvastatin 40 MG tablet Commonly known as: LIPITOR Take 1 tablet (40 mg total) by mouth  daily. Start taking on: Jan 14, 2022   clopidogrel 75 MG tablet Commonly known as: Plavix Take 1 tablet (75 mg total) by mouth daily for 21 days.   lisinopril-hydrochlorothiazide 10-12.5 MG tablet Commonly known as: ZESTORETIC Take 1 tablet by mouth daily.        Follow-up Information     Yolonda Kida, MD. Go in 1 week(s).   Specialties: Cardiology, Internal Medicine Contact information: [place] [address] [phone]                No Known Allergies  Consultations: Cardiology-[facility] Neurology  Procedures/Studies: DG Chest 2 View  Result Date: 01/12/2022 CLINICAL DATA:  Right-sided chest pain EXAM: CHEST - 2 VIEW COMPARISON:  03/16/2019 FINDINGS: Cardiac shadow is within normal limits. The lungs are well aerated bilaterally. No focal infiltrate or sizable effusion is seen. No acute bony abnormality is noted. IMPRESSION: No acute abnormality noted. Electronically Signed   By: Inez Catalina M.D.   On: 01/12/2022 02:05   MR BRAIN WO CONTRAST  Result Date: 01/12/2022 CLINICAL DATA:  Neurologic deficit EXAM: MRI HEAD WITHOUT CONTRAST TECHNIQUE: Multiplanar, multiecho pulse sequences of the brain and surrounding structures were obtained without intravenous contrast. COMPARISON:  None Available. FINDINGS: Brain: Small focus of acute ischemia the medial right temporal lobe. No other diffusion abnormality. There are a few scattered foci of chronic microhemorrhage in a nonspecific distribution. There is multifocal hyperintense T2-weighted signal within the white matter. Parenchymal volume and CSF spaces are normal. The midline structures are normal. Vascular: Major flow voids are preserved. Skull and upper cervical spine: Normal calvarium and skull base. Visualized upper cervical spine and soft tissues are normal. Sinuses/Orbits:No paranasal sinus fluid levels or advanced mucosal thickening. No mastoid or middle ear effusion. Normal orbits. IMPRESSION: 1. Small focus of acute ischemia within the medial right temporal lobe. No hemorrhage or mass effect. 2. Findings of chronic small vessel ischemia. Electronically Signed   By: Ulyses Jarred M.D.   On: 01/12/2022 21:05   NM Myocar Multi W/Spect W/Wall Motion / EF  Result Date: 01/12/2022   Findings  are equivocal. The study is intermediate risk.   No ST deviation was noted.   LV perfusion is equivocal.   Left ventricular function is abnormal. Nuclear stress EF: 37 %. The left ventricular ejection fraction is moderately decreased (30-44%). End diastolic cavity size is normal.   Prior study not available for comparison. Abnormal myocardial perfusion scan There is poor tracer uptake at stress and rest Persistent defects basally inferiorly no clear evidence of reversibility Moderately depressed left ventricular function around 37% globally There is no clear evidence of reversible ischemia but this study does not rule out coronary disease or infarct Study may be consistent with an ischemic cardiomyopathy We will consider invasive evaluation or cardiac CT   ECHOCARDIOGRAM COMPLETE  Result Date: 01/12/2022    ECHOCARDIOGRAM REPORT   Patient Name:   HARSIMRAN TLASECA Date of Exam: 01/12/2022 Medical Rec #:  [id number]     Height:       69.0 in Accession #:    [id number]    Weight:       178.0 lb Date of Birth:  29-Dec-1960     BSA:          1.966 m Patient Age:    33 years      BP:           153/89 mmHg Patient Gender: M  HR:           67 bpm. Exam Location:  [facility] Procedure: 2D Echo, Color Doppler and Cardiac Doppler Indications:     R07.9 Chest Pain  History:         Patient has prior history of Echocardiogram examinations. Risk                  Factors:Hypertension.  Sonographer:     Charmayne Sheer Referring Phys:  [id number] [place] TANG Diagnosing Phys: Yolonda Kida MD IMPRESSIONS  1. Left ventricular ejection fraction, by estimation, is 55 to 60%. The left ventricle has normal function. The left ventricle has no regional wall motion abnormalities. Left ventricular diastolic parameters were normal.  2. Right ventricular systolic function is normal. The right ventricular size is normal.  3. The mitral valve is normal in structure. Trivial mitral valve regurgitation.  4. The aortic valve is normal in  structure. Aortic valve regurgitation is not visualized. FINDINGS  Left Ventricle: Left ventricular ejection fraction, by estimation, is 55 to 60%. The left ventricle has normal function. The left ventricle has no regional wall motion abnormalities. The left ventricular internal cavity size was normal in size. There is  no left ventricular hypertrophy. Left ventricular diastolic parameters were normal. Right Ventricle: The right ventricular size is normal. No increase in right ventricular wall thickness. Right ventricular systolic function is normal. Left Atrium: Left atrial size was normal in size. Right Atrium: Right atrial size was normal in size. Pericardium: There is no evidence of pericardial effusion. Mitral Valve: The mitral valve is normal in structure. Trivial mitral valve regurgitation. MV peak gradient, 1.9 mmHg. The mean mitral valve gradient is 1.0 mmHg. Tricuspid Valve: The tricuspid valve is normal in structure. Tricuspid valve regurgitation is mild. Aortic Valve: The aortic valve is normal in structure. Aortic valve regurgitation is not visualized. Aortic valve mean gradient measures 3.0 mmHg. Aortic valve peak gradient measures 5.6 mmHg. Aortic valve area, by VTI measures 3.64 cm. Pulmonic Valve: The pulmonic valve was normal in structure. Pulmonic valve regurgitation is not visualized. Aorta: The ascending aorta was not well visualized. IAS/Shunts: No atrial level shunt detected by color flow Doppler.  LEFT VENTRICLE PLAX 2D LVIDd:         5.20 cm   Diastology LVIDs:         3.50 cm   LV e' medial:    4.90 cm/s LV PW:         1.25 cm   LV E/e' medial:  11.0 LV IVS:        0.93 cm   LV e' lateral:   4.35 cm/s LVOT diam:     2.30 cm   LV E/e' lateral: 12.4 LV SV:         70 LV SV Index:   36 LVOT Area:     4.15 cm  RIGHT VENTRICLE RV Basal diam:  3.09 cm LEFT ATRIUM             Index        RIGHT ATRIUM           Index LA diam:        3.90 cm 1.98 cm/m   RA Area:     13.40 cm LA Vol (A2C):    38.2 ml 19.43 ml/m  RA Volume:   34.10 ml  17.34 ml/m LA Vol (A4C):   48.4 ml 24.62 ml/m LA Biplane Vol: 46.1 ml 23.45 ml/m  AORTIC VALVE  PULMONIC VALVE AV Area (Vmax):    3.56 cm     PV Vmax:       0.72 m/s AV Area (Vmean):   3.39 cm     PV Vmean:      48.300 cm/s AV Area (VTI):     3.64 cm     PV VTI:        0.099 m AV Vmax:           118.00 cm/s  PV Peak grad:  2.1 mmHg AV Vmean:          83.800 cm/s  PV Mean grad:  1.0 mmHg AV VTI:            0.193 m AV Peak Grad:      5.6 mmHg AV Mean Grad:      3.0 mmHg LVOT Vmax:         101.00 cm/s LVOT Vmean:        68.300 cm/s LVOT VTI:          0.169 m LVOT/AV VTI ratio: 0.88  AORTA Ao Root diam: 3.90 cm MITRAL VALVE MV Area (PHT): 2.78 cm    SHUNTS MV Area VTI:   3.44 cm    Systemic VTI:  0.17 m MV Peak grad:  1.9 mmHg    Systemic Diam: 2.30 cm MV Mean grad:  1.0 mmHg MV Vmax:       0.69 m/s MV Vmean:      37.3 cm/s MV Decel Time: 273 msec MV E velocity: 54.00 cm/s MV A velocity: 51.80 cm/s MV E/A ratio:  1.04 Yolonda Kida MD Electronically signed by Yolonda Kida MD Signature Date/Time: 01/12/2022/4:12:58 PM    Final    CT Angio Chest/Abd/Pel for Dissection W and/or W/WO  Result Date: 01/12/2022 CLINICAL DATA:  61 year old male with chest and back pain. Right chest pain radiating to the sternum. Right arm numbness. Blurred vision, dizziness. SVT. Abnormal troponin. EXAM: CT ANGIOGRAPHY CHEST, ABDOMEN AND PELVIS TECHNIQUE: Non-contrast CT of the chest was initially obtained. Multidetector CT imaging through the chest, abdomen and pelvis was performed using the standard protocol during bolus administration of intravenous contrast. Multiplanar reconstructed images and MIPs were obtained and reviewed to evaluate the vascular anatomy. RADIATION DOSE REDUCTION: This exam was performed according to the departmental dose-optimization program which includes automated exposure control, adjustment of the mA and/or kV according to patient  size and/or use of iterative reconstruction technique. CONTRAST:  156mL OMNIPAQUE IOHEXOL 350 MG/ML SOLN COMPARISON:  CTA Chest, Abdomen, and Pelvis 03/16/2019. Chest radiographs 0144 hours today. FINDINGS: CTA CHEST FINDINGS Cardiovascular: Stable cardiac size at the upper limits of normal. No pericardial effusion. No definite calcified coronary artery atherosclerosis. Stable mild tortuosity of the thoracic aorta since 2020. Negative for thoracic aortic aneurysm or dissection. Proximal great vessels are patent. Central pulmonary arteries also appear patent. No pulmonary embolism identified. Mediastinum/Nodes: Negative. No mediastinal mass or lymphadenopathy. Lungs/Pleura: Major airways are patent although with bilateral perihilar generalized bronchial wall thickening. Confluent ground-glass opacity in multiple segments of the left lower lobe, similar to the left lower lobe appearance in 2020, although the right lower lobe today is largely clear. There is minor dependent ground-glass opacity on the right. No consolidation at this time. No pleural effusion. Some underlying centrilobular emphysema which is most apparent in the upper lobes. Musculoskeletal: Chronic thoracic disc and endplate degeneration. Chronic exaggerated upper thoracic kyphosis. Lower cervical disc and endplate degeneration. No acute osseous abnormality identified. Review of the  MIP images confirms the above findings. CTA ABDOMEN AND PELVIS FINDINGS VASCULAR Patent abdominal aorta and its branches with little to no atherosclerosis. Accessory right renal artery (normal variant). No abdominal aortic aneurysm or dissection. Patent and mildly tortuous iliac arteries. Patent proximal femoral arteries. No significant visible iliofemoral atherosclerosis. Review of the MIP images confirms the above findings. NON-VASCULAR Hepatobiliary: Negative liver aside from questionable steatosis. Negative gallbladder. Pancreas: Negative. Spleen: Negative.  Adrenals/Urinary Tract: Normal adrenal glands. Kidneys appears stable since 2020 and within normal limits. No nephrolithiasis or pararenal inflammation. Decompressed ureters. Chronic right posterior bladder diverticulum (series 5, image 270) was present in 2020 but is more conspicuous today, measuring 2.2 cm. Underlying urinary bladder distension (400 mL), no perivesical inflammation. Stomach/Bowel: No dilated large bowel. Mildly redundant colon with no active inflammation. Normal appendix on series 5, image 225. The cecum is on a lax mesentery. Negative terminal ileum. No dilated small bowel. Decompressed stomach and duodenum. No free air, free fluid, or mesenteric inflammation identified. Lymphatic: No lymphadenopathy identified. Reproductive: Negative. Other: No pelvic free fluid. Musculoskeletal: Chronic lower lumbar disc degeneration and facet degeneration. Vacuum disc and vacuum facet. Chronic benign appearing proximal right femur subchondral cyst appears stable. No acute osseous abnormality identified. Review of the MIP images confirms the above findings. IMPRESSION: 1. Negative for aortic aneurysm or dissection. No pulmonary embolism identified. 2. Left lower lobe ground-glass opacity, similar to a 2020 CTA finding and favored to reflect recurrent Pneumonia. Other differential considerations such as chronic organizing pneumonia felt less likely. Some underlying chronic lung disease with generalized bronchial wall thickening and Emphysema (ICD10-J43.9). No pleural effusion. 3. No acute or inflammatory process identified in the abdomen or pelvis. Normal appendix. 4. Bladder distension (400 mL) with a chronic right posterior bladder diverticulum. Query urinary retention. Electronically Signed   By: Genevie Ann M.D.   On: 01/12/2022 04:13      Subjective: Seen and examined the day of discharge.  Stable no distress.  Chest pain resolved.  Stable for discharge home.  Discharge Exam: Vitals:   01/13/22 0930  01/13/22 1100  BP:  (!) 152/80  Pulse: 69 75  Resp: 13 14  Temp:  98 F (36.7 C)  SpO2: 100% 100%   Vitals:   01/13/22 0745 01/13/22 0800 01/13/22 0930 01/13/22 1100  BP: (!) 157/96 (!) 160/83  (!) 152/80  Pulse: 68 77 69 75  Resp: 16 (!) 21 13 14   Temp:    98 F (36.7 C)  TempSrc:    Oral  SpO2: 95% 96% 100% 100%  Weight:      Height:        General: Pt is alert, awake, not in acute distress Cardiovascular: RRR, S1/S2 +, no rubs, no gallops Respiratory: CTA bilaterally, no wheezing, no rhonchi Abdominal: Soft, NT, ND, bowel sounds + Extremities: no edema, no cyanosis    The results of significant diagnostics from this hospitalization (including imaging, microbiology, ancillary and laboratory) are listed below for reference.     Microbiology: Recent Results (from the past 240 hour(s))  Resp Panel by RT-PCR (Flu A&B, Covid) Nasopharyngeal Swab     Status: None   Collection Time: 01/12/22  7:44 AM   Specimen: Nasopharyngeal Swab; Nasopharyngeal(NP) swabs in vial transport medium  Result Value Ref Range Status   SARS Coronavirus 2 by RT PCR NEGATIVE NEGATIVE Final    Comment: (NOTE) SARS-CoV-2 target nucleic acids are NOT DETECTED.  The SARS-CoV-2 RNA is generally detectable in upper respiratory specimens during the acute  phase of infection. The lowest concentration of SARS-CoV-2 viral copies this assay can detect is 138 copies/mL. A negative result does not preclude SARS-Cov-2 infection and should not be used as the sole basis for treatment or other patient management decisions. A negative result may occur with  improper specimen collection/handling, submission of specimen other than nasopharyngeal swab, presence of viral mutation(s) within the areas targeted by this assay, and inadequate number of viral copies(<138 copies/mL). A negative result must be combined with clinical observations, patient history, and epidemiological information. The expected result is  Negative.  Fact Sheet for Patients:  EntrepreneurPulse.com.au  Fact Sheet for Healthcare Providers:  IncredibleEmployment.be  This test is no t yet approved or cleared by the [place] FDA and  has been authorized for detection and/or diagnosis of SARS-CoV-2 by FDA under an Emergency Use Authorization (EUA). This EUA will remain  in effect (meaning this test can be used) for the duration of the COVID-19 declaration under Section 564(b)(1) of the Act, 21 U.S.C.section 360bbb-3(b)(1), unless the authorization is terminated  or revoked sooner.       Influenza A by PCR NEGATIVE NEGATIVE Final   Influenza B by PCR NEGATIVE NEGATIVE Final    Comment: (NOTE) The Xpert Xpress SARS-CoV-2/FLU/RSV plus assay is intended as an aid in the diagnosis of influenza from Nasopharyngeal swab specimens and should not be used as a sole basis for treatment. Nasal washings and aspirates are unacceptable for Xpert Xpress SARS-CoV-2/FLU/RSV testing.  Fact Sheet for Patients: EntrepreneurPulse.com.au  Fact Sheet for Healthcare Providers: IncredibleEmployment.be  This test is not yet approved or cleared by the [place] FDA and has been authorized for detection and/or diagnosis of SARS-CoV-2 by FDA under an Emergency Use Authorization (EUA). This EUA will remain in effect (meaning this test can be used) for the duration of the COVID-19 declaration under Section 564(b)(1) of the Act, 21 U.S.C. section 360bbb-3(b)(1), unless the authorization is terminated or revoked.  Performed at [facility], [address] [place], [place] [address]      Labs: BNP (last 3 results) No results for input(s): BNP in the last 8760 hours. Basic Metabolic Panel: Recent Labs  Lab 01/12/22 0149 01/13/22 0634  NA 136 135  K 4.8 4.2  CL 111 111  CO2 17* 16*  GLUCOSE 113* 105*  BUN 47* 25*  CREATININE 1.72* 1.10  CALCIUM  9.6 9.0   Liver Function Tests: Recent Labs  Lab 01/12/22 0149  AST 51*  ALT 63*  ALKPHOS 81  BILITOT 0.7  PROT 8.4*  ALBUMIN 4.5   Recent Labs  Lab 01/12/22 0149  LIPASE 39   No results for input(s): AMMONIA in the last 168 hours. CBC: Recent Labs  Lab 01/12/22 0149  WBC 7.7  HGB 17.0  HCT 51.3  MCV 85.4  PLT 286   Cardiac Enzymes: Recent Labs  Lab 01/12/22 0149  CKTOTAL 175   BNP: Invalid input(s): POCBNP CBG: Recent Labs  Lab 01/12/22 0756  GLUCAP 126*   D-Dimer No results for input(s): DDIMER in the last 72 hours. Hgb A1c Recent Labs    01/12/22 0744  HGBA1C 6.3*   Lipid Profile Recent Labs    01/13/22 0634  CHOL 228*  HDL 38*  LDLCALC 128*  TRIG 310*  CHOLHDL 6.0   Thyroid function studies No results for input(s): TSH, T4TOTAL, T3FREE, THYROIDAB in the last 72 hours.  Invalid input(s): FREET3 Anemia work up No results for input(s): VITAMINB12, FOLATE, FERRITIN, TIBC, IRON, RETICCTPCT in  the last 72 hours. Urinalysis    Component Value Date/Time   BILIRUBINUR negative 08/01/2016 1340   PROTEINUR trace 08/01/2016 1340   UROBILINOGEN 0.2 08/01/2016 1340   NITRITE negative 08/01/2016 1340   LEUKOCYTESUR Negative 08/01/2016 1340   Sepsis Labs Invalid input(s): PROCALCITONIN,  WBC,  LACTICIDVEN Microbiology Recent Results (from the past 240 hour(s))  Resp Panel by RT-PCR (Flu A&B, Covid) Nasopharyngeal Swab     Status: None   Collection Time: 01/12/22  7:44 AM   Specimen: Nasopharyngeal Swab; Nasopharyngeal(NP) swabs in vial transport medium  Result Value Ref Range Status   SARS Coronavirus 2 by RT PCR NEGATIVE NEGATIVE Final    Comment: (NOTE) SARS-CoV-2 target nucleic acids are NOT DETECTED.  The SARS-CoV-2 RNA is generally detectable in upper respiratory specimens during the acute phase of infection. The lowest concentration of SARS-CoV-2 viral copies this assay can detect is 138 copies/mL. A negative result does not  preclude SARS-Cov-2 infection and should not be used as the sole basis for treatment or other patient management decisions. A negative result may occur with  improper specimen collection/handling, submission of specimen other than nasopharyngeal swab, presence of viral mutation(s) within the areas targeted by this assay, and inadequate number of viral copies(<138 copies/mL). A negative result must be combined with clinical observations, patient history, and epidemiological information. The expected result is Negative.  Fact Sheet for Patients:  EntrepreneurPulse.com.au  Fact Sheet for Healthcare Providers:  IncredibleEmployment.be  This test is no t yet approved or cleared by the [place] FDA and  has been authorized for detection and/or diagnosis of SARS-CoV-2 by FDA under an Emergency Use Authorization (EUA). This EUA will remain  in effect (meaning this test can be used) for the duration of the COVID-19 declaration under Section 564(b)(1) of the Act, 21 U.S.C.section 360bbb-3(b)(1), unless the authorization is terminated  or revoked sooner.       Influenza A by PCR NEGATIVE NEGATIVE Final   Influenza B by PCR NEGATIVE NEGATIVE Final    Comment: (NOTE) The Xpert Xpress SARS-CoV-2/FLU/RSV plus assay is intended as an aid in the diagnosis of influenza from Nasopharyngeal swab specimens and should not be used as a sole basis for treatment. Nasal washings and aspirates are unacceptable for Xpert Xpress SARS-CoV-2/FLU/RSV testing.  Fact Sheet for Patients: EntrepreneurPulse.com.au  Fact Sheet for Healthcare Providers: IncredibleEmployment.be  This test is not yet approved or cleared by the [place] FDA and has been authorized for detection and/or diagnosis of SARS-CoV-2 by FDA under an Emergency Use Authorization (EUA). This EUA will remain in effect (meaning this test can be used) for the  duration of the COVID-19 declaration under Section 564(b)(1) of the Act, 21 U.S.C. section 360bbb-3(b)(1), unless the authorization is terminated or revoked.  Performed at [facility], [address], [place], [place] [address]      Time coordinating discharge: Over 30 minutes  SIGNED:   Sidney Ace, MD  [facility] 01/13/2022, 1:19 PM Pager   If 7PM-7AM, please contact night-coverage

## 2022-01-13 NOTE — Progress Notes (Signed)
Brief progress note.  Full note to follow.  61 year old male presented with chest pain.  Underwent nuclear stress test.  Negative for ischemia.  Cleared for discharge from cardiology standpoint however patient did endorse intermittent right arm numbness, visual disturbance, slurred speech.  Follow-up MRI revealed small area of acute ischemia and medial right temporal lobe.  Neurology consultation requested.  Message sent to Dr. Amada Jupiter.  Discharge pending neurology evaluation and recommendations.  Oscar Sermeno [employer] md

## 2022-01-13 NOTE — Consult Note (Signed)
Neurology Consultation Reason for Consult: Stroke Referring Physician: Georgeann Oppenheim, S  CC: Muscle cramps  History is obtained from: Patient  HPI: Oscar Clark is a 61 y.o. male with history of hypertension.  He was complaining of "numbness" of the right arm, though on further discussion, it sounds like he occasionally has a cramp of his right pectoralis muscle.  This is currently improved.  I try and tease out if he actually has any numbness, meaning any difference of sensation when touching it during these episodes,and he denies this.  He presented with chest pain, but due to the complaints of numbness an MRI was obtained which demonstrates a tiny periventricular infarct on the left  ROS: A 14 point ROS was performed and is negative except as noted in the HPI.  Past Medical History:  Diagnosis Date   Hypertension      Family History  Problem Relation Age of Onset   Hypertension Mother    Hypertension Brother      Social History:  reports that he has been smoking cigarettes. He has a 9.00 pack-year smoking history. He has never used smokeless tobacco. He reports current alcohol use. He reports that he does not use drugs.   Exam: Current vital signs: BP (!) 152/80   Pulse 75   Temp 98 F (36.7 C) (Oral)   Resp 14   Ht 5\' 9"  (1.753 m)   Wt 80.7 kg   SpO2 100%   BMI 26.29 kg/m  Vital signs in last 24 hours: Temp:  [98 F (36.7 C)] 98 F (36.7 C) (05/19 1100) Pulse Rate:  [53-77] 75 (05/19 1100) Resp:  [13-21] 14 (05/19 1100) BP: (126-160)/(74-117) 152/80 (05/19 1100) SpO2:  [92 %-100 %] 100 % (05/19 1100)   Physical Exam  Constitutional: Appears well-developed and well-nourished.  Psych: Affect appropriate to situation Eyes: No scleral injection HENT: No OP obstruction MSK: no joint deformities.  Cardiovascular: Normal rate and regular rhythm.  Respiratory: Effort normal, non-labored breathing GI: Soft.  No distension. There is no tenderness.  Skin:  WDI  Neuro: Mental Status: Patient is awake, alert, oriented to person, place, month, year, and situation. Patient is able to give a clear and coherent history. No signs of aphasia or neglect Cranial Nerves: II: Visual Fields are full. Pupils are equal, round, and reactive to light.   III,IV, VI: EOMI without ptosis or diploplia.  V: Facial sensation is symmetric to temperature VII: Facial movement is symmetric.  VIII: hearing is intact to voice X: Uvula elevates symmetrically XI: Shoulder shrug is symmetric. XII: tongue is midline without atrophy or fasciculations.  Motor: Tone is normal. Bulk is normal. 5/5 strength was present in all four extremities.  Sensory: Sensation is symmetric to light touch and temperature in the arms and legs. Cerebellar: FNF and HKS are intact bilaterally  I have reviewed labs in epic and the results pertinent to this consultation are: LDL 128 A1c 6.3  I have reviewed the images obtained: MRI brain-small periventricular infarct on the left, likely small vessel disease  Impression: 61 year old male who had an incidentally found small vessel infarct on MRI.  He will need small vessel risk factor modification, including statin therapy.  I would do dual antiplatelet therapy for 3 weeks followed by aspirin monotherapy.  The right arm numbness that he reports is actually I think musculoskeletal pain, and he just did understand what he meant by numbness.  Recommendations: 1) atorvastatin 40 mg nightly 2) no need for therapy evaluations given he  is asymptomatic 3) aspirin Plavix for 3 weeks followed by aspirin monotherapy (could do dual therapy if needed from a cardiac perspective) 4) neurology be available as needed.  Ritta Slot, MD [facility] [phone]  If 7pm- 7am, please page neurology on call as listed in AMION.

## 2022-01-13 NOTE — ED Notes (Signed)
Pt ambulated to toilet to urinate.

## 2022-01-13 NOTE — Progress Notes (Signed)
[facility] Cardiology    SUBJECTIVE: Patient states he feels reasonably well but complains of elevated blood pressure right arm axilla discomfort is resolved no headaches no vision changes denies palpitations or tachycardia   Vitals:   01/13/22 0658 01/13/22 0741 01/13/22 0745 01/13/22 0800  BP: (!) 160/117  (!) 157/96 (!) 160/83  Pulse:  60 68 77  Resp:  20 16 (!) 21  Temp:      TempSrc:      SpO2:  98% 95% 96%  Weight:      Height:        No intake or output data in the 24 hours ending 01/13/22 0826    PHYSICAL EXAM  General: Well developed, well nourished, in no acute distress HEENT:  Normocephalic and atramatic Neck:  No JVD.  Lungs: Clear bilaterally to auscultation and percussion. Heart: HRRR . Normal S1 and S2 without gallops or murmurs.  Abdomen: Bowel sounds are positive, abdomen soft and non-tender  Msk:  Back normal, normal gait. Normal strength and tone for age. Extremities: No clubbing, cyanosis or edema.   Neuro: Alert and oriented X 3. Psych:  Good affect, responds appropriately   LABS: Basic Metabolic Panel: Recent Labs    01/12/22 0149 01/13/22 0634  NA 136 135  K 4.8 4.2  CL 111 111  CO2 17* 16*  GLUCOSE 113* 105*  BUN 47* 25*  CREATININE 1.72* 1.10  CALCIUM 9.6 9.0   Liver Function Tests: Recent Labs    01/12/22 0149  AST 51*  ALT 63*  ALKPHOS 81  BILITOT 0.7  PROT 8.4*  ALBUMIN 4.5   Recent Labs    01/12/22 0149  LIPASE 39   CBC: Recent Labs    01/12/22 0149  WBC 7.7  HGB 17.0  HCT 51.3  MCV 85.4  PLT 286   Cardiac Enzymes: Recent Labs    01/12/22 0149  CKTOTAL 175   BNP: Invalid input(s): POCBNP D-Dimer: No results for input(s): DDIMER in the last 72 hours. Hemoglobin A1C: Recent Labs    01/12/22 0744  HGBA1C 6.3*   Fasting Lipid Panel: Recent Labs    01/13/22 0634  CHOL 228*  HDL 38*  LDLCALC 128*  TRIG 310*  CHOLHDL 6.0   Thyroid Function Tests: No results for input(s): TSH, T4TOTAL, T3FREE,  THYROIDAB in the last 72 hours.  Invalid input(s): FREET3 Anemia Panel: No results for input(s): VITAMINB12, FOLATE, FERRITIN, TIBC, IRON, RETICCTPCT in the last 72 hours.  DG Chest 2 View  Result Date: 01/12/2022 CLINICAL DATA:  Right-sided chest pain EXAM: CHEST - 2 VIEW COMPARISON:  03/16/2019 FINDINGS: Cardiac shadow is within normal limits. The lungs are well aerated bilaterally. No focal infiltrate or sizable effusion is seen. No acute bony abnormality is noted. IMPRESSION: No acute abnormality noted. Electronically Signed   By: Alcide Clever M.D.   On: 01/12/2022 02:05   MR BRAIN WO CONTRAST  Result Date: 01/12/2022 CLINICAL DATA:  Neurologic deficit EXAM: MRI HEAD WITHOUT CONTRAST TECHNIQUE: Multiplanar, multiecho pulse sequences of the brain and surrounding structures were obtained without intravenous contrast. COMPARISON:  None Available. FINDINGS: Brain: Small focus of acute ischemia the medial right temporal lobe. No other diffusion abnormality. There are a few scattered foci of chronic microhemorrhage in a nonspecific distribution. There is multifocal hyperintense T2-weighted signal within the white matter. Parenchymal volume and CSF spaces are normal. The midline structures are normal. Vascular: Major flow voids are preserved. Skull and upper cervical spine: Normal calvarium and skull base. Visualized  upper cervical spine and soft tissues are normal. Sinuses/Orbits:No paranasal sinus fluid levels or advanced mucosal thickening. No mastoid or middle ear effusion. Normal orbits. IMPRESSION: 1. Small focus of acute ischemia within the medial right temporal lobe. No hemorrhage or mass effect. 2. Findings of chronic small vessel ischemia. Electronically Signed   By: Ulyses Jarred M.D.   On: 01/12/2022 21:05   NM Myocar Multi W/Spect W/Wall Motion / EF  Result Date: 01/12/2022   Findings are equivocal. The study is intermediate risk.   No ST deviation was noted.   LV perfusion is equivocal.    Left ventricular function is abnormal. Nuclear stress EF: 37 %. The left ventricular ejection fraction is moderately decreased (30-44%). End diastolic cavity size is normal.   Prior study not available for comparison. Abnormal myocardial perfusion scan There is poor tracer uptake at stress and rest Persistent defects basally inferiorly no clear evidence of reversibility Moderately depressed left ventricular function around 37% globally There is no clear evidence of reversible ischemia but this study does not rule out coronary disease or infarct Study may be consistent with an ischemic cardiomyopathy We will consider invasive evaluation or cardiac CT   ECHOCARDIOGRAM COMPLETE  Result Date: 01/12/2022    ECHOCARDIOGRAM REPORT   Patient Name:   Oscar Clark Date of Exam: 01/12/2022 Medical Rec #:  [id number]     Height:       69.0 in Accession #:    [id number]    Weight:       178.0 lb Date of Birth:  [birth year]     BSA:          1.966 m Patient Age:    61 years      BP:           153/89 mmHg Patient Gender: M             HR:           67 bpm. Exam Location:  [facility] Procedure: 2D Echo, Color Doppler and Cardiac Doppler Indications:     R07.9 Chest Pain  History:         Patient has prior history of Echocardiogram examinations. Risk                  Factors:Hypertension.  Sonographer:     Charmayne Sheer Referring Phys:  [id number] [employer] TANG Diagnosing Phys: Yolonda Kida MD IMPRESSIONS  1. Left ventricular ejection fraction, by estimation, is 55 to 60%. The left ventricle has normal function. The left ventricle has no regional wall motion abnormalities. Left ventricular diastolic parameters were normal.  2. Right ventricular systolic function is normal. The right ventricular size is normal.  3. The mitral valve is normal in structure. Trivial mitral valve regurgitation.  4. The aortic valve is normal in structure. Aortic valve regurgitation is not visualized. FINDINGS  Left Ventricle: Left ventricular ejection  fraction, by estimation, is 55 to 60%. The left ventricle has normal function. The left ventricle has no regional wall motion abnormalities. The left ventricular internal cavity size was normal in size. There is  no left ventricular hypertrophy. Left ventricular diastolic parameters were normal. Right Ventricle: The right ventricular size is normal. No increase in right ventricular wall thickness. Right ventricular systolic function is normal. Left Atrium: Left atrial size was normal in size. Right Atrium: Right atrial size was normal in size. Pericardium: There is no evidence of pericardial effusion. Mitral Valve: The mitral valve is normal in structure. Trivial  mitral valve regurgitation. MV peak gradient, 1.9 mmHg. The mean mitral valve gradient is 1.0 mmHg. Tricuspid Valve: The tricuspid valve is normal in structure. Tricuspid valve regurgitation is mild. Aortic Valve: The aortic valve is normal in structure. Aortic valve regurgitation is not visualized. Aortic valve mean gradient measures 3.0 mmHg. Aortic valve peak gradient measures 5.6 mmHg. Aortic valve area, by VTI measures 3.64 cm. Pulmonic Valve: The pulmonic valve was normal in structure. Pulmonic valve regurgitation is not visualized. Aorta: The ascending aorta was not well visualized. IAS/Shunts: No atrial level shunt detected by color flow Doppler.  LEFT VENTRICLE PLAX 2D LVIDd:         5.20 cm   Diastology LVIDs:         3.50 cm   LV e' medial:    4.90 cm/s LV PW:         1.25 cm   LV E/e' medial:  11.0 LV IVS:        0.93 cm   LV e' lateral:   4.35 cm/s LVOT diam:     2.30 cm   LV E/e' lateral: 12.4 LV SV:         70 LV SV Index:   36 LVOT Area:     4.15 cm  RIGHT VENTRICLE RV Basal diam:  3.09 cm LEFT ATRIUM             Index        RIGHT ATRIUM           Index LA diam:        3.90 cm 1.98 cm/m   RA Area:     13.40 cm LA Vol (A2C):   38.2 ml 19.43 ml/m  RA Volume:   34.10 ml  17.34 ml/m LA Vol (A4C):   48.4 ml 24.62 ml/m LA Biplane Vol: 46.1  ml 23.45 ml/m  AORTIC VALVE                    PULMONIC VALVE AV Area (Vmax):    3.56 cm     PV Vmax:       0.72 m/s AV Area (Vmean):   3.39 cm     PV Vmean:      48.300 cm/s AV Area (VTI):     3.64 cm     PV VTI:        0.099 m AV Vmax:           118.00 cm/s  PV Peak grad:  2.1 mmHg AV Vmean:          83.800 cm/s  PV Mean grad:  1.0 mmHg AV VTI:            0.193 m AV Peak Grad:      5.6 mmHg AV Mean Grad:      3.0 mmHg LVOT Vmax:         101.00 cm/s LVOT Vmean:        68.300 cm/s LVOT VTI:          0.169 m LVOT/AV VTI ratio: 0.88  AORTA Ao Root diam: 3.90 cm MITRAL VALVE MV Area (PHT): 2.78 cm    SHUNTS MV Area VTI:   3.44 cm    Systemic VTI:  0.17 m MV Peak grad:  1.9 mmHg    Systemic Diam: 2.30 cm MV Mean grad:  1.0 mmHg MV Vmax:       0.69 m/s MV Vmean:      37.3 cm/s MV Decel  Time: 273 msec MV E velocity: 54.00 cm/s MV A velocity: 51.80 cm/s MV E/A ratio:  1.04 Yolonda Kida MD Electronically signed by Yolonda Kida MD Signature Date/Time: 01/12/2022/4:12:58 PM    Final    CT Angio Chest/Abd/Pel for Dissection W and/or W/WO  Result Date: 01/12/2022 CLINICAL DATA:  61 year old male with chest and back pain. Right chest pain radiating to the sternum. Right arm numbness. Blurred vision, dizziness. SVT. Abnormal troponin. EXAM: CT ANGIOGRAPHY CHEST, ABDOMEN AND PELVIS TECHNIQUE: Non-contrast CT of the chest was initially obtained. Multidetector CT imaging through the chest, abdomen and pelvis was performed using the standard protocol during bolus administration of intravenous contrast. Multiplanar reconstructed images and MIPs were obtained and reviewed to evaluate the vascular anatomy. RADIATION DOSE REDUCTION: This exam was performed according to the departmental dose-optimization program which includes automated exposure control, adjustment of the mA and/or kV according to patient size and/or use of iterative reconstruction technique. CONTRAST:  197mL OMNIPAQUE IOHEXOL 350 MG/ML SOLN COMPARISON:   CTA Chest, Abdomen, and Pelvis 03/16/2019. Chest radiographs 0144 hours today. FINDINGS: CTA CHEST FINDINGS Cardiovascular: Stable cardiac size at the upper limits of normal. No pericardial effusion. No definite calcified coronary artery atherosclerosis. Stable mild tortuosity of the thoracic aorta since 2020. Negative for thoracic aortic aneurysm or dissection. Proximal great vessels are patent. Central pulmonary arteries also appear patent. No pulmonary embolism identified. Mediastinum/Nodes: Negative. No mediastinal mass or lymphadenopathy. Lungs/Pleura: Major airways are patent although with bilateral perihilar generalized bronchial wall thickening. Confluent ground-glass opacity in multiple segments of the left lower lobe, similar to the left lower lobe appearance in 2020, although the right lower lobe today is largely clear. There is minor dependent ground-glass opacity on the right. No consolidation at this time. No pleural effusion. Some underlying centrilobular emphysema which is most apparent in the upper lobes. Musculoskeletal: Chronic thoracic disc and endplate degeneration. Chronic exaggerated upper thoracic kyphosis. Lower cervical disc and endplate degeneration. No acute osseous abnormality identified. Review of the MIP images confirms the above findings. CTA ABDOMEN AND PELVIS FINDINGS VASCULAR Patent abdominal aorta and its branches with little to no atherosclerosis. Accessory right renal artery (normal variant). No abdominal aortic aneurysm or dissection. Patent and mildly tortuous iliac arteries. Patent proximal femoral arteries. No significant visible iliofemoral atherosclerosis. Review of the MIP images confirms the above findings. NON-VASCULAR Hepatobiliary: Negative liver aside from questionable steatosis. Negative gallbladder. Pancreas: Negative. Spleen: Negative. Adrenals/Urinary Tract: Normal adrenal glands. Kidneys appears stable since 2020 and within normal limits. No nephrolithiasis or  pararenal inflammation. Decompressed ureters. Chronic right posterior bladder diverticulum (series 5, image 270) was present in 2020 but is more conspicuous today, measuring 2.2 cm. Underlying urinary bladder distension (400 mL), no perivesical inflammation. Stomach/Bowel: No dilated large bowel. Mildly redundant colon with no active inflammation. Normal appendix on series 5, image 225. The cecum is on a lax mesentery. Negative terminal ileum. No dilated small bowel. Decompressed stomach and duodenum. No free air, free fluid, or mesenteric inflammation identified. Lymphatic: No lymphadenopathy identified. Reproductive: Negative. Other: No pelvic free fluid. Musculoskeletal: Chronic lower lumbar disc degeneration and facet degeneration. Vacuum disc and vacuum facet. Chronic benign appearing proximal right femur subchondral cyst appears stable. No acute osseous abnormality identified. Review of the MIP images confirms the above findings. IMPRESSION: 1. Negative for aortic aneurysm or dissection. No pulmonary embolism identified. 2. Left lower lobe ground-glass opacity, similar to a 2020 CTA finding and favored to reflect recurrent Pneumonia. Other differential considerations such as chronic organizing  pneumonia felt less likely. Some underlying chronic lung disease with generalized bronchial wall thickening and Emphysema (ICD10-J43.9). No pleural effusion. 3. No acute or inflammatory process identified in the abdomen or pelvis. Normal appendix. 4. Bladder distension (400 mL) with a chronic right posterior bladder diverticulum. Query urinary retention. Electronically Signed   By: Genevie Ann M.D.   On: 01/12/2022 04:13     Echo normal left ventricular function  TELEMETRY: Sinus rhythm nonspecific ST-T wave changes:  ASSESSMENT AND PLAN:  Principal Problem:   Chest pain Active Problems:   Essential hypertension   Elevated troponin   Right arm numbness   Tobacco abuse   Acute renal failure superimposed on  stage 2 chronic kidney disease ([place])  Acute CVA   Plan Consider Neurology in pt  Rec Aspirin and possibly plavix No evidence of AFIB do not recommend high-level anticoagulants Rec HTN control Consider out cardiac CT vs cardiac cath Myoview was of limited quality Recommend statin therapy Advised patient refrain from tobacco abuse Follow-up with cardiology as an outpatient    Yolonda Kida, MD 01/13/2022 8:26 AM

## 2022-01-13 NOTE — ED Notes (Signed)
Pt sleeping at this time. NAD at this time, respirations even and unlabored

## 2022-01-13 NOTE — TOC CM/SW Note (Signed)
Patient has orders to discharge home today. Chart reviewed. PCP is [employer], PA-C. On room air. No wounds. No TOC needs identified. CSW signing off.  Dayton Scrape, [place]

## 2022-01-13 NOTE — ED Notes (Signed)
RN to bedside to introduce self to pt. Pt awake and watching TV. No needs or requests at this time.

## 2022-01-19 ENCOUNTER — Ambulatory Visit: Payer: BC Managed Care – PPO | Admitting: Physician Assistant

## 2022-01-24 ENCOUNTER — Ambulatory Visit (INDEPENDENT_AMBULATORY_CARE_PROVIDER_SITE_OTHER): Payer: BC Managed Care – PPO | Admitting: Family Medicine

## 2022-01-24 ENCOUNTER — Encounter: Payer: Self-pay | Admitting: Family Medicine

## 2022-01-24 VITALS — BP 191/107 | HR 86 | Resp 16 | Wt 177.0 lb

## 2022-01-24 DIAGNOSIS — M79601 Pain in right arm: Secondary | ICD-10-CM

## 2022-01-24 DIAGNOSIS — R778 Other specified abnormalities of plasma proteins: Secondary | ICD-10-CM | POA: Diagnosis not present

## 2022-01-24 DIAGNOSIS — I10 Essential (primary) hypertension: Secondary | ICD-10-CM | POA: Diagnosis not present

## 2022-01-24 DIAGNOSIS — M25531 Pain in right wrist: Secondary | ICD-10-CM

## 2022-01-24 DIAGNOSIS — Z8673 Personal history of transient ischemic attack (TIA), and cerebral infarction without residual deficits: Secondary | ICD-10-CM

## 2022-01-24 MED ORDER — FELODIPINE ER 5 MG PO TB24: 5.0000 mg | ORAL_TABLET | Freq: Every day | ORAL | 0 refills | Status: DC

## 2022-01-24 MED ORDER — CEPHALEXIN 500 MG PO CAPS: 500.0000 mg | ORAL_CAPSULE | Freq: Three times a day (TID) | ORAL | 0 refills | Status: AC

## 2022-01-24 NOTE — Progress Notes (Signed)
I,Jana Robinson,acting as a scribe for Mila Merry, MD.,have documented all relevant documentation on the behalf of Mila Merry, MD,as directed by  Mila Merry, MD while in the presence of Mila Merry, MD.   Established patient visit   Patient: Oscar Clark   DOB: 06-20-1961   61 y.o. Male  MRN: [id number] Visit Date: 01/24/2022  Today's healthcare provider: Mila Merry, MD   Chief Complaint  Patient presents with   Hospitalization Follow-up   Subjective    HPI  Follow up Hospitalization  Patient was seen at ER at [facility] on 01/12/2022, admitted overnight and discharged on 01/13/2022. He was treated for Chest pain, Elevated Troponin, slurred speech and arm numbness.  He had negative CTA of chest and head.  He had equivocal NM myocardial perfusion scan with moderately depressed LV function around 37%, although felt to be limited in quality. Echo was unremarkable with normal EF.  MRI of brain revealed small area of acute ischemia in right temporal lobe, which neurology apparently felt to be incidental.  Was given aspirin, nitroglycerine and morphine in the ER. Was seen by cardiology and neurology and started on DUAP therapy x 3 weeks with plan to change to ASA monotherapy thereafter. Put on atorvastatin 40mg  a day.    He reports good compliance with treatment. He reports this condition is improved.  Patient states he feels somewhat improved since discharge. However he has had pain running down his right arm and some swelling around his right wrist.  -----------------------------------------------------------------------------------------  Hypertension, follow-up  BP Readings from Last 3 Encounters:  01/24/22 (!) 191/107  01/13/22 (!) 155/77  01/05/22 (!) 186/118   Wt Readings from Last 3 Encounters:  01/24/22 177 lb (80.3 kg)  01/12/22 178 lb (80.7 kg)  01/05/22 178 lb 8 oz (81 kg)     He was last seen for hypertension 2 weeks ago.  BP at that visit was 186 118.  Management since that visit includes   He states that he stopped taking amlodipine when he was discharged from the hospital last week, but is taking lisinopril-hctz consistently.  Patient had been out of medication at last visit for 1 month.    He reports good compliance with treatment. He is not having side effects. Headaches, swelling in the right arm. He is following a Regular diet. He is not exercising. He does not smoke.  Use of agents associated with hypertension: none.   Outside blood pressures are 175/119 this morning. Symptoms: No chest pain No chest pressure  No palpitations No syncope  No dyspnea No orthopnea  No paroxysmal nocturnal dyspnea No lower extremity edema     ---------------------------------------------------------------------------------------------------    Medications: Outpatient Medications Prior to Visit  Medication Sig   acetaminophen (TYLENOL) 500 MG tablet Take 1,000 mg by mouth every 8 (eight) hours as needed for headache.   amLODipine (NORVASC) 10 MG tablet Take 1 tablet (10 mg total) by mouth daily.   aspirin EC 81 MG tablet Take 1 tablet (81 mg total) by mouth daily.   atorvastatin (LIPITOR) 40 MG tablet Take 1 tablet (40 mg total) by mouth daily.   clopidogrel (PLAVIX) 75 MG tablet Take 1 tablet (75 mg total) by mouth daily for 21 days.   lisinopril-hydrochlorothiazide (ZESTORETIC) 10-12.5 MG tablet Take 1 tablet by mouth daily.   No facility-administered medications prior to visit.    Review of Systems  Constitutional:  Negative for appetite change, chills and fever.  Respiratory:  Negative for chest tightness, shortness  of breath and wheezing.   Cardiovascular:  Negative for chest pain and palpitations.  Gastrointestinal:  Negative for abdominal pain, nausea and vomiting.      Objective    BP (!) 191/107 (BP Location: Left Arm, Patient Position: Sitting, Cuff Size: Large)   Pulse 86   Resp 16   Wt 177 lb (80.3 kg)   SpO2 100%    BMI 26.14 kg/m    Physical Exam   General: Appearance:     Well developed, well nourished male in no acute distress  Eyes:    PERRL, conjunctiva/corneas clear, EOM's intact       Lungs:     Clear to auscultation bilaterally, respirations unlabored  Heart:    Normal heart rate. Normal rhythm. No murmurs, rubs, or gallops.    MS:   All extremities are intact.    Neurologic:   Awake, alert, oriented x 3. No apparent focal neurological defect.   Skin:   Slight erythema round right wrist. Slightly tender to palpation.      Assessment & Plan     1. Primary hypertension  He apparently stopped amlodipine when discharged. Unclear if he was having any problems with it or just misunderstood discharged instructions. Will continue lisinopril-hctz and add felodipine (PLENDIL) 5 MG 24 hr tablet; Take 1 tablet (5 mg total) by mouth daily.  Dispense: 30 tablet; Refill: 0   Consider changing lisinopril to valsartan if not much better at follow up.   Follow up in 2 weeks.    2. History of CVA in adulthood Per neurology, MRI findings were thought to be incidental, although he was put in 3 weeks DUAP and is to continue ECASA thereafter.   3. Elevated troponin Likely demand ischemia due to advanced hypertension. Echo was normal with no clear evidence of ischemia on perfusion, although this was of limited quality.   4. Right arm pain He feels this is a side effect of some of his new medications. Will put atorvastatin on hold for a few weeks to see if this helps.   5. Right wrist pain Is a bit inflamed, suspect this may be related related to Ivs from hospital stay. Will cover with.  - cephALEXin (KEFLEX) 500 MG capsule; Take 1 capsule (500 mg total) by mouth 3 (three) times daily for 10 days.  Dispense: 30 capsule; Refill: 0   Future Appointments  Date Time Provider [place]  02/10/2022  8:20 AM Caryn Section, Kirstie Peri, MD [facility]     Addressed extensive list of chronic and acute medical  problems today requiring 40 minutes reviewing his medical record, counseling patient regarding his conditions and coordination of care.        The entirety of the information documented in the History of Present Illness, Review of Systems and Physical Exam were personally obtained by me. Portions of this information were initially documented by the CMA and reviewed by me for thoroughness and accuracy.     Lelon Huh, MD  [facility] [phone] (phone) [phone] (fax)  [place]

## 2022-01-24 NOTE — Patient Instructions (Addendum)
Please review the attached list of medications and notify my office if there are any errors.   Put atorvastatin on hold for now   Start taking felodipine 5mg  once a day IN ADDITION to your current blood pressure medications.

## 2022-01-26 ENCOUNTER — Ambulatory Visit: Payer: Self-pay

## 2022-01-26 DIAGNOSIS — M109 Gout, unspecified: Secondary | ICD-10-CM | POA: Diagnosis not present

## 2022-01-26 NOTE — Telephone Encounter (Signed)
He was prescribed an antibiotic for this on Tuesday, if it's not  getting better then he needs Xray of wrist and hand, or go to urgent care if it's severe.

## 2022-01-26 NOTE — Telephone Encounter (Signed)
  Chief Complaint: right wrist and hand swelling and pain and redness Symptoms: severe pain Frequency: Sunday Pertinent Negatives: Patient denies fever, red streaks, drainage or cuts or previous injury Disposition: [] ED /[x] Urgent Care (no appt availability in office) / [] Appointment(In office/virtual)/ []  [place] [facility]/ [] Home Care/ [] Refused Recommended Disposition /[] [facility]/ []  Follow-up with PCP Additional Notes: there was appt- but time of appt outside of time frame pt needed to be seen.             Knuckle pinkie  at th ewrist Reason for Disposition  SEVERE hand swelling (e.g., swelling of entire hand and up into forearm)  Answer Assessment - Initial Assessment Questions 1. ONSET: "When did the swelling start?" (e.g., minutes, hours, days)     Sunday 2. LOCATION: "What part of the hand is swollen?"  "Are both hands swollen or just one hand?"     Right wrist and hand 3. SEVERITY: "How bad is the swelling?" (e.g., localized; mild, moderate, severe)   - BALL OR LUMP: small ball or lump   - LOCALIZED: puffy or swollen area or patch of skin   - JOINT SWELLING: swelling of a joint   - MILD: puffiness or mild swelling of fingers or hand   - MODERATE: fingers and hand are swollen   - SEVERE: swelling of entire hand and up into forearm     Right hand wrist 4. REDNESS: "Does the swelling look red or infected?"     Red and tender 5. PAIN: "Is the swelling painful to touch?" If Yes, ask: "How painful is it?"   (Scale 1-10; mild, moderate or severe)     severe 6. FEVER: "Do you have a fever?" If Yes, ask: "What is it, how was it measured, and when did it start?"      no 7. CAUSE: "What do you think is causing the hand swelling?" (e.g., heat, insect bite, pregnancy, recent injury)     No idea 8. MEDICAL HISTORY: "Do you have a history of heart failure, kidney disease, liver failure, or cancer?"     no 9. RECURRENT SYMPTOM: "Have you had hand swelling  before?" If Yes, ask: "When was the last time?" "What happened that time?"     no 10. OTHER SYMPTOMS: "Do you have any other symptoms?" (e.g., blurred vision, difficulty breathing, headache)       none 11. PREGNANCY: "Is there any chance you are pregnant?" "When was your last menstrual period?"       N/a  Protocols used: Hand Swelling-A-AH

## 2022-02-08 NOTE — Progress Notes (Deleted)
     I,Sha'taria Kip Cropp,acting as a Education administrator for Lelon Huh, MD.,have documented all relevant documentation on the behalf of Lelon Huh, MD,as directed by  Lelon Huh, MD while in the presence of Lelon Huh, MD.   Established patient visit   Patient: Oscar Clark   DOB: [birth year]   61 y.o. Male  MRN: [id number] Visit Date: 02/10/2022  Today's healthcare provider: Lelon Huh, MD   No chief complaint on file.  Subjective    HPI  Hypertension, follow-up  BP Readings from Last 3 Encounters:  01/24/22 (!) 191/107  01/13/22 (!) 155/77  01/05/22 (!) 186/118   Wt Readings from Last 3 Encounters:  01/24/22 177 lb (80.3 kg)  01/12/22 178 lb (80.7 kg)  01/05/22 178 lb 8 oz (81 kg)     He was last seen for hypertension 2-3 weeks ago.  BP at that visit was 191/107. Management since that visit includes Will continue lisinopril-hctz and add felodipine (PLENDIL) 5 MG .Consider changing lisinopril to valsartan if not much better at follow up. Marland Kitchen  He reports {excellent/good/fair/poor:19665} compliance with treatment. He {is/is not:9024} having side effects. {document side effects if present:1}   Outside blood pressures are {***enter patient reported home BP readings, or 'not being checked':1}. Symptoms: {Yes/No:20286} chest pain {Yes/No:20286} chest pressure  {Yes/No:20286} palpitations {Yes/No:20286} syncope  {Yes/No:20286} dyspnea {Yes/No:20286} orthopnea  {Yes/No:20286} paroxysmal nocturnal dyspnea {Yes/No:20286} lower extremity edema   Pertinent labs Lab Results  Component Value Date   CHOL 228 (H) 01/13/2022   HDL 38 (L) 01/13/2022   LDLCALC 128 (H) 01/13/2022   TRIG 310 (H) 01/13/2022   CHOLHDL 6.0 01/13/2022   Lab Results  Component Value Date   NA 135 01/13/2022   K 4.2 01/13/2022   CREATININE 1.10 01/13/2022   GFRNONAA >60 01/13/2022   GLUCOSE 105 (H) 01/13/2022   TSH 1.150 06/11/2015     The 10-year ASCVD risk score (Arnett DK, et al., 2019) is:  46.9%  ---------------------------------------------------------------------------------------------------   Medications: Outpatient Medications Prior to Visit  Medication Sig   acetaminophen (TYLENOL) 500 MG tablet Take 1,000 mg by mouth every 8 (eight) hours as needed for headache.   aspirin EC 81 MG tablet Take 1 tablet (81 mg total) by mouth daily.   atorvastatin (LIPITOR) 40 MG tablet Take 1 tablet (40 mg total) by mouth daily.   felodipine (PLENDIL) 5 MG 24 hr tablet Take 1 tablet (5 mg total) by mouth daily.   lisinopril-hydrochlorothiazide (ZESTORETIC) 10-12.5 MG tablet Take 1 tablet by mouth daily.   No facility-administered medications prior to visit.    Review of Systems  {Labs  Heme  Chem  Endocrine  Serology  Results Review (optional):23779}   Objective    There were no vitals taken for this visit. {Show previous vital signs (optional):23777}  Physical Exam  ***  No results found for any visits on 02/10/22.  Assessment & Plan     *** . No follow-ups on file.      {provider attestation***:1}   Lelon Huh, MD  [facility] [phone] (phone) [phone] (fax)  [place]

## 2022-02-10 ENCOUNTER — Ambulatory Visit: Payer: BC Managed Care – PPO | Admitting: Family Medicine

## 2022-02-12 ENCOUNTER — Inpatient Hospital Stay
Admission: EM | Admit: 2022-02-12 | Discharge: 2022-02-15 | DRG: 254 | Disposition: A | Discharge status: Other Acute Inpatient Hospital | Payer: BC Managed Care – PPO | Attending: Internal Medicine | Admitting: Internal Medicine

## 2022-02-12 ENCOUNTER — Other Ambulatory Visit: Payer: Self-pay

## 2022-02-12 ENCOUNTER — Observation Stay: Payer: BC Managed Care – PPO

## 2022-02-12 ENCOUNTER — Emergency Department: Payer: BC Managed Care – PPO

## 2022-02-12 DIAGNOSIS — Z720 Tobacco use: Secondary | ICD-10-CM | POA: Diagnosis not present

## 2022-02-12 DIAGNOSIS — M5412 Radiculopathy, cervical region: Secondary | ICD-10-CM | POA: Diagnosis not present

## 2022-02-12 DIAGNOSIS — M4802 Spinal stenosis, cervical region: Secondary | ICD-10-CM | POA: Diagnosis present

## 2022-02-12 DIAGNOSIS — R0789 Other chest pain: Secondary | ICD-10-CM | POA: Diagnosis not present

## 2022-02-12 DIAGNOSIS — I129 Hypertensive chronic kidney disease with stage 1 through stage 4 chronic kidney disease, or unspecified chronic kidney disease: Secondary | ICD-10-CM | POA: Diagnosis present

## 2022-02-12 DIAGNOSIS — R1031 Right lower quadrant pain: Secondary | ICD-10-CM | POA: Diagnosis not present

## 2022-02-12 DIAGNOSIS — I639 Cerebral infarction, unspecified: Secondary | ICD-10-CM

## 2022-02-12 DIAGNOSIS — Z7982 Long term (current) use of aspirin: Secondary | ICD-10-CM

## 2022-02-12 DIAGNOSIS — I2510 Atherosclerotic heart disease of native coronary artery without angina pectoris: Secondary | ICD-10-CM | POA: Diagnosis not present

## 2022-02-12 DIAGNOSIS — R202 Paresthesia of skin: Secondary | ICD-10-CM | POA: Diagnosis not present

## 2022-02-12 DIAGNOSIS — R200 Anesthesia of skin: Secondary | ICD-10-CM | POA: Diagnosis present

## 2022-02-12 DIAGNOSIS — R778 Other specified abnormalities of plasma proteins: Secondary | ICD-10-CM

## 2022-02-12 DIAGNOSIS — Z79899 Other long term (current) drug therapy: Secondary | ICD-10-CM

## 2022-02-12 DIAGNOSIS — I214 Non-ST elevation (NSTEMI) myocardial infarction: Secondary | ICD-10-CM | POA: Diagnosis not present

## 2022-02-12 DIAGNOSIS — E1122 Type 2 diabetes mellitus with diabetic chronic kidney disease: Secondary | ICD-10-CM | POA: Diagnosis not present

## 2022-02-12 DIAGNOSIS — M4722 Other spondylosis with radiculopathy, cervical region: Secondary | ICD-10-CM | POA: Diagnosis not present

## 2022-02-12 DIAGNOSIS — R29818 Other symptoms and signs involving the nervous system: Secondary | ICD-10-CM | POA: Diagnosis not present

## 2022-02-12 DIAGNOSIS — Z8249 Family history of ischemic heart disease and other diseases of the circulatory system: Secondary | ICD-10-CM | POA: Diagnosis not present

## 2022-02-12 DIAGNOSIS — I10 Essential (primary) hypertension: Secondary | ICD-10-CM | POA: Diagnosis not present

## 2022-02-12 DIAGNOSIS — N182 Chronic kidney disease, stage 2 (mild): Secondary | ICD-10-CM | POA: Diagnosis present

## 2022-02-12 DIAGNOSIS — R079 Chest pain, unspecified: Secondary | ICD-10-CM

## 2022-02-12 DIAGNOSIS — Z8673 Personal history of transient ischemic attack (TIA), and cerebral infarction without residual deficits: Secondary | ICD-10-CM | POA: Diagnosis not present

## 2022-02-12 DIAGNOSIS — Z87442 Personal history of urinary calculi: Secondary | ICD-10-CM | POA: Diagnosis not present

## 2022-02-12 DIAGNOSIS — E785 Hyperlipidemia, unspecified: Secondary | ICD-10-CM | POA: Diagnosis present

## 2022-02-12 DIAGNOSIS — F17210 Nicotine dependence, cigarettes, uncomplicated: Secondary | ICD-10-CM | POA: Diagnosis not present

## 2022-02-12 DIAGNOSIS — E782 Mixed hyperlipidemia: Secondary | ICD-10-CM | POA: Diagnosis not present

## 2022-02-12 DIAGNOSIS — Z7902 Long term (current) use of antithrombotics/antiplatelets: Secondary | ICD-10-CM | POA: Diagnosis not present

## 2022-02-12 DIAGNOSIS — I771 Stricture of artery: Secondary | ICD-10-CM | POA: Diagnosis present

## 2022-02-12 DIAGNOSIS — R531 Weakness: Secondary | ICD-10-CM | POA: Diagnosis not present

## 2022-02-12 DIAGNOSIS — H538 Other visual disturbances: Secondary | ICD-10-CM | POA: Diagnosis not present

## 2022-02-12 LAB — BASIC METABOLIC PANEL
Anion gap: 7 (ref 5–15)
BUN: 31 mg/dL — ABNORMAL HIGH (ref 8–23)
CO2: 17 mmol/L — ABNORMAL LOW (ref 22–32)
Calcium: 8.8 mg/dL — ABNORMAL LOW (ref 8.9–10.3)
Chloride: 114 mmol/L — ABNORMAL HIGH (ref 98–111)
Creatinine, Ser: 1.29 mg/dL — ABNORMAL HIGH (ref 0.61–1.24)
GFR, Estimated: >60 mL/min (ref >60)
Glucose, Bld: 143 mg/dL — ABNORMAL HIGH (ref 70–99)
Potassium: 4.1 mmol/L (ref 3.5–5.1)
Sodium: 138 mmol/L (ref 135–145)

## 2022-02-12 LAB — CBC WITH DIFFERENTIAL/PLATELET
Abs Immature Granulocytes: 0.01 10*3/uL (ref 0.00–0.07)
Basophils Absolute: 0 10*3/uL (ref 0.0–0.1)
Basophils Relative: 1 %
Eosinophils Absolute: 0.1 10*3/uL (ref 0.0–0.5)
Eosinophils Relative: 2 %
HCT: 48.9 % (ref 39.0–52.0)
Hemoglobin: 16 g/dL (ref 13.0–17.0)
Immature Granulocytes: 0 %
Lymphocytes Relative: 16 %
Lymphs Abs: 0.8 10*3/uL (ref 0.7–4.0)
MCH: 27.7 pg (ref 26.0–34.0)
MCHC: 32.7 g/dL (ref 30.0–36.0)
MCV: 84.7 fL (ref 80.0–100.0)
Monocytes Absolute: 0.3 10*3/uL (ref 0.1–1.0)
Monocytes Relative: 6 %
Neutro Abs: 3.9 10*3/uL (ref 1.7–7.7)
Neutrophils Relative %: 75 %
Platelets: 294 10*3/uL (ref 150–400)
RBC: 5.77 MIL/uL (ref 4.22–5.81)
RDW: 13.1 % (ref 11.5–15.5)
WBC: 5.2 10*3/uL (ref 4.0–10.5)
nRBC: 0 % (ref 0.0–0.2)

## 2022-02-12 LAB — BRAIN NATRIURETIC PEPTIDE: B Natriuretic Peptide: 9.9 pg/mL (ref 0.0–100.0)

## 2022-02-12 LAB — APTT: aPTT: 30 seconds (ref 24–36)

## 2022-02-12 LAB — PROTIME-INR
INR: 1 (ref 0.8–1.2)
Prothrombin Time: 13.5 seconds (ref 11.4–15.2)

## 2022-02-12 LAB — TROPONIN I (HIGH SENSITIVITY)
Troponin I (High Sensitivity): 152 ng/L (ref <18)
Troponin I (High Sensitivity): 420 ng/L (ref <18)
Troponin I (High Sensitivity): 955 ng/L (ref <18)
Troponin I (High Sensitivity): 1398 ng/L (ref <18)

## 2022-02-12 LAB — D-DIMER, QUANTITATIVE: D-Dimer, Quant: 0.31 ug/mL-FEU (ref 0.00–0.50)

## 2022-02-12 LAB — HEPARIN LEVEL (UNFRACTIONATED): Heparin Unfractionated: 0.41 IU/mL (ref 0.30–0.70)

## 2022-02-12 MED ORDER — FELODIPINE ER 5 MG PO TB24
5.0000 mg | ORAL_TABLET | Freq: Every day | ORAL | Status: DC
  Administered 2022-02-13 – 2022-02-14 (×2): 5 mg via ORAL
  Filled 2022-02-12 (×3): qty 1

## 2022-02-12 MED ORDER — ACETAMINOPHEN 325 MG PO TABS
650.0000 mg | ORAL_TABLET | Freq: Four times a day (QID) | ORAL | Status: DC | PRN
  Administered 2022-02-13: 650 mg via ORAL

## 2022-02-12 MED ORDER — HYDRALAZINE HCL 20 MG/ML IJ SOLN: 5.0000 mg | INTRAMUSCULAR | Status: DC | PRN

## 2022-02-12 MED ORDER — ONDANSETRON HCL 4 MG/2ML IJ SOLN: 4.0000 mg | Freq: Three times a day (TID) | INTRAMUSCULAR | Status: DC | PRN

## 2022-02-12 MED ORDER — SODIUM CHLORIDE 0.9% FLUSH
3.0000 mL | Freq: Two times a day (BID) | INTRAVENOUS | Status: DC
  Administered 2022-02-12: 3 mL via INTRAVENOUS

## 2022-02-12 MED ORDER — LISINOPRIL 10 MG PO TABS
10.0000 mg | ORAL_TABLET | Freq: Every day | ORAL | Status: DC
  Administered 2022-02-13 – 2022-02-14 (×2): 10 mg via ORAL
  Filled 2022-02-12 (×2): qty 1

## 2022-02-12 MED ORDER — OXYCODONE-ACETAMINOPHEN 5-325 MG PO TABS: 1.0000 | ORAL_TABLET | ORAL | Status: DC | PRN

## 2022-02-12 MED ORDER — HEPARIN BOLUS VIA INFUSION
4000.0000 [IU] | Freq: Once | INTRAVENOUS | Status: AC
  Administered 2022-02-12: 4000 [IU] via INTRAVENOUS
  Filled 2022-02-12: qty 4000

## 2022-02-12 MED ORDER — ATORVASTATIN CALCIUM 20 MG PO TABS
40.0000 mg | ORAL_TABLET | Freq: Every day | ORAL | Status: DC
Start: 2022-02-12 — End: 2022-02-14
  Administered 2022-02-12 – 2022-02-14 (×3): 40 mg via ORAL
  Filled 2022-02-12 (×3): qty 2

## 2022-02-12 MED ORDER — NICOTINE 21 MG/24HR TD PT24
21.0000 mg | MEDICATED_PATCH | Freq: Every day | TRANSDERMAL | Status: DC
  Administered 2022-02-12 – 2022-02-14 (×3): 21 mg via TRANSDERMAL
  Filled 2022-02-12 (×3): qty 1

## 2022-02-12 MED ORDER — ASPIRIN 81 MG PO CHEW
324.0000 mg | CHEWABLE_TABLET | Freq: Once | ORAL | Status: AC
Start: 2022-02-12 — End: 2022-02-12
  Administered 2022-02-12: 324 mg via ORAL
  Filled 2022-02-12: qty 4

## 2022-02-12 MED ORDER — MORPHINE SULFATE (PF) 2 MG/ML IV SOLN: 2.0000 mg | INTRAVENOUS | Status: DC | PRN

## 2022-02-12 MED ORDER — HEPARIN (PORCINE) 25000 UT/250ML-% IV SOLN
1100.0000 [IU]/h | INTRAVENOUS | Status: DC
  Administered 2022-02-12: 1000 [IU]/h via INTRAVENOUS
  Administered 2022-02-13: 1100 [IU]/h via INTRAVENOUS
  Filled 2022-02-12 (×3): qty 250

## 2022-02-12 MED ORDER — HYDROCHLOROTHIAZIDE 12.5 MG PO TABS
12.5000 mg | ORAL_TABLET | Freq: Every day | ORAL | Status: DC
  Administered 2022-02-13 – 2022-02-14 (×2): 12.5 mg via ORAL
  Filled 2022-02-12 (×2): qty 1

## 2022-02-12 MED ORDER — ENOXAPARIN SODIUM 40 MG/0.4ML IJ SOSY: 40.0000 mg | PREFILLED_SYRINGE | INTRAMUSCULAR | Status: DC

## 2022-02-12 MED ORDER — LISINOPRIL-HYDROCHLOROTHIAZIDE 10-12.5 MG PO TABS: 1.0000 | ORAL_TABLET | Freq: Every day | ORAL | Status: DC

## 2022-02-12 MED ORDER — NITROGLYCERIN 0.4 MG SL SUBL: 0.4000 mg | SUBLINGUAL_TABLET | SUBLINGUAL | Status: DC | PRN

## 2022-02-12 MED ORDER — ASPIRIN 81 MG PO TBEC
81.0000 mg | DELAYED_RELEASE_TABLET | Freq: Every day | ORAL | Status: DC
  Administered 2022-02-13 – 2022-02-14 (×2): 81 mg via ORAL
  Filled 2022-02-12 (×2): qty 1

## 2022-02-12 NOTE — Consult Note (Signed)
ANTICOAGULATION CONSULT NOTE  Pharmacy Consult for heparin Indication: chest pain/ACS  No Known Allergies  Patient Measurements: Height: 5\' 9"  (175.3 cm) Weight: 80.3 kg (177 lb) IBW/kg (Calculated) : 70.7 Heparin Dosing Weight: 80.3kg  Vital Signs: Temp: 98.4 F (36.9 C) (06/18 2053) Temp Source: Oral (06/18 2053) BP: 147/91 (06/18 2053) Pulse Rate: 64 (06/18 2053)  Labs: Recent Labs    02/12/22 1211 02/12/22 1403 02/12/22 1731 02/12/22 2209  HGB 16.0  --   --   --   HCT 48.9  --   --   --   PLT 294  --   --   --   APTT  --  30  --   --   LABPROT  --  13.5  --   --   INR  --  1.0  --   --   HEPARINUNFRC  --   --   --  0.41  CREATININE  --   --  1.29*  --   TROPONINIHS 152* 420* 955*  --      Estimated Creatinine Clearance: 60.1 mL/min (A) (by C-G formula based on SCr of 1.29 mg/dL (H)).   Medical History: Past Medical History:  Diagnosis Date   Hypertension     Medications:  PTA: Clopidogrel 75mg  QD, Aspirin 81mg  QD Inpatient: Heparin infusion (6/18 >>>) Allergies: NKDA  Assessment: Oscar Clark is a 61 y.o. male with medical history significant of hypertension, hyperlipidemia, SVT, tobacco abuse, CKD-2, recent diagnosis of stroke, who presents with chest pain, right arm tingling. Pharmacy consulted for management of heparin drip in setting of ACS/STEMI.  6/18 CT head: No acute intracranial hemorrhage, mass effect, or herniation  Goal of Therapy:  Heparin level 0.3-0.7 units/ml Monitor platelets by anticoagulation protocol: Yes  Date Time HL   Rate/Comment 6/18 2209 0.41 Therapeutic x 1  Plan:  Continue heparin infusion at 1000 units/hr Recheck HL w/ AM labs to confirm Continue to monitor H&H and platelets daily while on heparin gtt.  7/18, PharmD, [facility] 02/12/2022 10:38 PM

## 2022-02-12 NOTE — Assessment & Plan Note (Signed)
CT scan of C spine showed advanced multilevel cervical spondylosis, most pronounced at C5-6 and C6-7. No evidence of high-grade canal stenosis by CT. Findings of foraminal stenosis at multiple levels including bilaterally at C4-5 and C5-6, and right-sided at C6-7. -prn percocet and tylenol  -may need to f/u with neurosurgery

## 2022-02-12 NOTE — ED Provider Triage Note (Signed)
Emergency Medicine Provider Triage Evaluation Note  Oscar Clark , a 61 y.o. male  was evaluated in triage.  Pt complains of right arm pain for a few weeks. No paresthesias. Reports intermittent weakness in grip strength.  No neck pain. No injuries to neck or arm. No pain now, but this morning reports pain was 10. Feels like pain comes in attacks. Had CP this morning while sitting for 1.5 hours, reports he felt sweaty. No SOB. No history of heart problems. Uses tobacco.  Review of Systems  Positive: Right arm pain, intermittent paresthesias and weakness Negative: CP/ SOB  Physical Exam  There were no vitals taken for this visit. Gen:   Awake, no distress   Resp:  Normal effort  MSK:   Moves extremities without difficulty Other:  No c-spine tenderness, though + Spurling to the right  Medical Decision Making  Medically screening exam initiated at 12:06 PM.  Appropriate orders placed.  Oscar Clark was informed that the remainder of the evaluation will be completed by another provider, this initial triage assessment does not replace that evaluation, and the importance of remaining in the ED until their evaluation is complete.     Oscar Hoehn, PA-C 02/12/22 1212

## 2022-02-12 NOTE — ED Triage Notes (Signed)
Pt states that he is having pain in his R chest under his armpit- pt states that the pain has been going on for a few weeks and has been seen for it previously- pt states it does cause pain in his R arm along with weakness- pt states it feels like pins and needles

## 2022-02-12 NOTE — Assessment & Plan Note (Signed)
Recently diagnosed with a small stroke -Continue aspirin and Lipitor

## 2022-02-12 NOTE — ED Notes (Signed)
Pt in bed, pt moved to room number 35, pt awaits negative ct for heparin gtt, report to [employer],

## 2022-02-12 NOTE — Assessment & Plan Note (Addendum)
Trop 152 --> 420. Pt has intermittent chest pain. LDL was 128 and A1c was 6.3 on 01/12/22, will not repeat today. Consulted Dr. Juliann Pares of cardiology.  Likely due to cardiac catheter tomorrow.   - admit to tele bed as inpt - IV heparin - prn Nitroglycerin, Morphine, and aspirin, lipitor

## 2022-02-12 NOTE — Assessment & Plan Note (Signed)
Lipitor 

## 2022-02-12 NOTE — Consult Note (Addendum)
ANTICOAGULATION CONSULT NOTE - Initial Consult  Pharmacy Consult for heparin Indication: chest pain/ACS  No Known Allergies  Patient Measurements: Height: 5\' 9"  (175.3 cm) Weight: 80.3 kg (177 lb) IBW/kg (Calculated) : 70.7 Heparin Dosing Weight: 80.3kg  Vital Signs: Temp: 97.8 F (36.6 C) (06/18 1207) Temp Source: Oral (06/18 1207) BP: 159/93 (06/18 1400) Pulse Rate: 69 (06/18 1400)  Labs: Recent Labs    02/12/22 1211 02/12/22 1403  HGB 16.0  --   HCT 48.9  --   PLT 294  --   TROPONINIHS 152* 420*    CrCl cannot be calculated (Patient's most recent lab result is older than the maximum 21 days allowed.).   Medical History: Past Medical History:  Diagnosis Date   Hypertension     Medications:  PTA: Clopidogrel 75mg  QD, Aspirin 81mg  QD Inpatient: Heparin infusion (6/18 >>>) Allergies: NKDA  Assessment: Oscar Clark is a 61 y.o. male with medical history significant of hypertension, hyperlipidemia, SVT, tobacco abuse, CKD-2, recent diagnosis of stroke, who presents with chest pain, right arm tingling. Pharmacy consulted for management of heparin drip in setting of ACS/STEMI.  6/18 CT head: No acute intracranial hemorrhage, mass effect, or herniation  Goal of Therapy:  Heparin level 0.3-0.7 units/ml Monitor platelets by anticoagulation protocol: Yes  Date Time aPTT/HL Rate/Comment   Plan:  Give 4000 units bolus x1; then start heparin infusion at 1000 units/hr Check anti-Xa level in 6 hours and daily once consecutively therapeutic. Continue to monitor H&H and platelets daily while on heparin gtt.  Montey Hora 02/12/2022,3:10 PM

## 2022-02-12 NOTE — H&P (Addendum)
History and Physical    Oscar Clark DTO:[id number] DOB: 10/23/1960 DOA: 02/12/2022  Referring MD/NP/PA:   PCP: [name], PA-C   Patient coming from:  The patient is coming from home.  At baseline, pt is independent for most of ADL.        Chief Complaint: chest pain, right arm tingling  HPI: Oscar Clark is a 61 y.o. male with medical history significant of hypertension, hyperlipidemia, SVT, tobacco abuse, CKD-2, recent diagnosis of stroke, who presents with chest pain, right arm tingling.  Patient was recently hospitalized from a 5/18 - 5/19 due to intermittent chest pain and  intermittent blurry vision and right arm numbness. Patient was found to have minimally elevated troponin to 34.  Patient had equivocal Myoview with poor tracer uptake.  2D echo was assuring.  Cardiology was consulted. Pt was cleared for discharge from cardiology standpoint. MRI brain revealed small area of acute ischemia in the right medial temporal lobe.  This finding is felt to be incidental after neurology consultation. CTA head and neck showed no large vessel occlusion. Pt was discharged on Lipitor, Plavix and aspirin. Pt is on dual antiplatelet therapy x3 weeks followed by aspirin monotherapy alone.  Pt states that he continues to have intermittent chest pain, which is located in the right upper chest, mild to moderate, aching, radiating to the right arm.  He states that he continues to have right arm pain, tingling and numbness.  He states that when he has right arm pain, he also feel whole headache.  No unilateral weakness in extremities.  No facial droop or slurred speech.  Patient does not have shortness breath, cough, fever or chills.  No nausea vomiting, diarrhea or abdominal pain.  No symptoms of UTI.  Data Reviewed and ED Course: pt was found to have troponin level 152, 420, negative D-dimer 0.31, WBC 5.2, pending BMP, temperature normal, blood pressure 159/93, heart rate 81, RR 25, oxygen saturation  95% on room air.  Chest x-ray negative. CT-head negative. Patient is placed on telemetry bed for position  CT scan of C spin 1. No acute osseous abnormality of the cervical spine. 2. Advanced multilevel cervical spondylosis, most pronounced at C5-6 and C6-7. No evidence of high-grade canal stenosis by CT. 3. Findings of foraminal stenosis at multiple levels including bilaterally at C4-5 and C5-6, and right-sided at C6-7.   EKG: I have personally reviewed.  Sinus rhythm, QTc 442, right bundle blockade, PVC, ST depression in inferior leads.   Review of Systems:   General: no fevers, chills, no body weight gain, has fatigue. Has HA HEENT: no blurry vision, hearing changes or sore throat Respiratory: no dyspnea, coughing, wheezing CV: has chest pain, no palpitations GI: no nausea, vomiting, abdominal pain, diarrhea, constipation GU: no dysuria, burning on urination, increased urinary frequency, hematuria  Ext: no leg edema Neuro: Has right arm tingling and numbness Skin: no rash, no skin tear. MSK: No muscle spasm, no deformity, no limitation of range of movement in spin Heme: No easy bruising.  Travel history: No recent long distant travel.   Allergy: No Known Allergies  Past Medical History:  Diagnosis Date   Hypertension     Past Surgical History:  Procedure Laterality Date   KIDNEY STONE SURGERY      Social History:  reports that he has been smoking cigarettes. He has a 9.00 pack-year smoking history. He has never used smokeless tobacco. He reports current alcohol use. He reports that he does not use drugs.  Family History:  Family History  Problem Relation Age of Onset   Hypertension Mother    Hypertension Brother      Prior to Admission medications   Medication Sig Start Date End Date Taking? Authorizing Provider  acetaminophen (TYLENOL) 500 MG tablet Take 1,000 mg by mouth every 8 (eight) hours as needed for headache.    [provider]  aspirin EC 81  MG tablet Take 1 tablet (81 mg total) by mouth daily. 01/13/22   Tresa Moore, MD  atorvastatin (LIPITOR) 40 MG tablet Take 1 tablet (40 mg total) by mouth daily. 01/14/22 03/15/22  Tresa Moore, MD  felodipine (PLENDIL) 5 MG 24 hr tablet Take 1 tablet (5 mg total) by mouth daily. 01/24/22   Malva Limes, MD  lisinopril-hydrochlorothiazide (ZESTORETIC) 10-12.5 MG tablet Take 1 tablet by mouth daily. 01/05/22   [name], PA-C    Physical Exam: Vitals:   02/12/22 1300 02/12/22 1305 02/12/22 1330 02/12/22 1400  BP: (!) 164/97 (!) 168/97 (!) 161/91 (!) 159/93  Pulse: 80 77 74 69  Resp: (!) 21 (!) 22 (!) 22 (!) 25  Temp:      TempSrc:      SpO2: 97% 96% 96% 98%  Weight:      Height:       General: Not in acute distress HEENT:       Eyes: PERRL, EOMI, no scleral icterus.       ENT: No discharge from the ears and nose, no pharynx injection, no tonsillar enlargement.        Neck: No JVD, no bruit, no mass felt. Heme: No neck lymph node enlargement. Cardiac: S1/S2, RRR, No murmurs, No gallops or rubs. Respiratory: No rales, wheezing, rhonchi or rubs. GI: Soft, nondistended, nontender, no rebound pain, no organomegaly, BS present. GU: No hematuria Ext: No pitting leg edema bilaterally. 1+DP/PT pulse bilaterally. Musculoskeletal: No joint deformities, No joint redness or warmth, no limitation of ROM in spin. Skin: No rashes.  Neuro: Alert, oriented X3, cranial nerves II-XII grossly intact, moves all extremities normally. Muscle strength 5/5 in all extremities, sensation to light touch intact.  Psych: Patient is not psychotic, no suicidal or hemocidal ideation.  Labs on Admission: I have personally reviewed following labs and imaging studies  CBC: Recent Labs  Lab 02/12/22 1211  WBC 5.2  NEUTROABS 3.9  HGB 16.0  HCT 48.9  MCV 84.7  PLT 294   Basic Metabolic Panel: No results for input(s): "NA", "K", "CL", "CO2", "GLUCOSE", "BUN", "CREATININE", "CALCIUM", "MG",  "PHOS" in the last 168 hours. GFR: CrCl cannot be calculated (Patient's most recent lab result is older than the maximum 21 days allowed.). Liver Function Tests: No results for input(s): "AST", "ALT", "ALKPHOS", "BILITOT", "PROT", "ALBUMIN" in the last 168 hours. No results for input(s): "LIPASE", "AMYLASE" in the last 168 hours. No results for input(s): "AMMONIA" in the last 168 hours. Coagulation Profile: No results for input(s): "INR", "PROTIME" in the last 168 hours. Cardiac Enzymes: No results for input(s): "CKTOTAL", "CKMB", "CKMBINDEX", "TROPONINI" in the last 168 hours. BNP (last 3 results) No results for input(s): "PROBNP" in the last 8760 hours. HbA1C: No results for input(s): "HGBA1C" in the last 72 hours. CBG: No results for input(s): "GLUCAP" in the last 168 hours. Lipid Profile: No results for input(s): "CHOL", "HDL", "LDLCALC", "TRIG", "CHOLHDL", "LDLDIRECT" in the last 72 hours. Thyroid Function Tests: No results for input(s): "TSH", "T4TOTAL", "FREET4", "T3FREE", "THYROIDAB" in the last 72 hours. Anemia Panel: No  results for input(s): "VITAMINB12", "FOLATE", "FERRITIN", "TIBC", "IRON", "RETICCTPCT" in the last 72 hours. Urine analysis:    Component Value Date/Time   BILIRUBINUR negative 08/01/2016 1340   PROTEINUR trace 08/01/2016 1340   UROBILINOGEN 0.2 08/01/2016 1340   NITRITE negative 08/01/2016 1340   LEUKOCYTESUR Negative 08/01/2016 1340   Sepsis Labs: @LABRCNTIP (procalcitonin:4,lacticidven:4) )No results found for this or any previous visit (from the past 240 hour(s)).   Radiological Exams on Admission: DG Chest 2 View  Result Date: 02/12/2022 CLINICAL DATA:  Chest pain EXAM: CHEST - 2 VIEW COMPARISON:  Chest x-ray dated Jan 04, 2022 FINDINGS: Cardiac and mediastinal contours are within normal limits. Mild elevation of the left hemidiaphragm. Mild left basilar opacity, likely due to atelectasis. Lungs otherwise clear. No pleural effusion or pneumothorax.  IMPRESSION: No acute cardiopulmonary disease. Electronically Signed   By: Yetta Glassman M.D.   On: 02/12/2022 14:40   CT Cervical Spine Wo Contrast  Result Date: 02/12/2022 CLINICAL DATA:  Right-sided cervical radiculopathy EXAM: CT CERVICAL SPINE WITHOUT CONTRAST TECHNIQUE: Multidetector CT imaging of the cervical spine was performed without intravenous contrast. Multiplanar CT image reconstructions were also generated. RADIATION DOSE REDUCTION: This exam was performed according to the departmental dose-optimization program which includes automated exposure control, adjustment of the mA and/or kV according to patient size and/or use of iterative reconstruction technique. COMPARISON:  01/13/2022 FINDINGS: Alignment: Facet joints are aligned without dislocation or traumatic listhesis. Dens and lateral masses are aligned. Slight reversal of the cervical lordosis. Grade 1 anterolisthesis of C7 on T1. Skull base and vertebrae: No acute fracture. No primary bone lesion or focal pathologic process. Soft tissues and spinal canal: No prevertebral fluid or swelling. No visible canal hematoma. Disc levels: Advanced degenerative disc disease at C5-6 and C6-7 and to a slightly lesser degree at C4-5. Multilevel bilateral facet arthropathy is more pronounced on the left. Findings of foraminal stenosis at multiple levels including bilaterally at C4-5, C5-6, and right-sided at C6-7. No evidence of high-grade canal stenosis by CT. Upper chest: Negative. Other: None. IMPRESSION: 1. No acute osseous abnormality of the cervical spine. 2. Advanced multilevel cervical spondylosis, most pronounced at C5-6 and C6-7. No evidence of high-grade canal stenosis by CT. 3. Findings of foraminal stenosis at multiple levels including bilaterally at C4-5 and C5-6, and right-sided at C6-7. Electronically Signed   By: Davina Poke D.O.   On: 02/12/2022 14:19      Assessment/Plan Principal Problem:   NSTEMI (non-ST elevated myocardial  infarction) ([place]) Active Problems:   Essential hypertension   CKD (chronic kidney disease), stage II   HLD (hyperlipidemia)   Stroke (HCC)   Numbness and tingling of right arm   Tobacco abuse   Principal Problem:   NSTEMI (non-ST elevated myocardial infarction) ([place]) Active Problems:   Essential hypertension   CKD (chronic kidney disease), stage II   HLD (hyperlipidemia)   Stroke (HCC)   Numbness and tingling of right arm   Tobacco abuse   Assessment and Plan: * NSTEMI (non-ST elevated myocardial infarction) (HCC) Trop 152 --> 420. Pt has intermittent chest pain. LDL was 128 and A1c was 6.3 on 01/12/22, will not repeat today. Consulted Dr. Clayborn Bigness of cardiology.  Likely due to cardiac catheter tomorrow.   - admit to tele bed as inpt - IV heparin - prn Nitroglycerin, Morphine, and aspirin, lipitor    Essential hypertension -IV hydralazine as needed -Felodipine and Zestoretic  CKD (chronic kidney disease), stage II Recently baseline creatinine 1.10 on 01/14/1972 -Pending BMP  HLD (hyperlipidemia) - Lipitor  Stroke [facility]) Recently diagnosed with a small stroke -Continue aspirin and Lipitor   Numbness and tingling of right arm CT scan of C spin showed advanced multilevel cervical spondylosis, most pronounced at C5-6 and C6-7. No evidence of high-grade canal stenosis by CT. Findings of foraminal stenosis at multiple levels including bilaterally at C4-5 and C5-6, and right-sided at C6-7. -prn percocet and tylenol  -may need to f/u with neurosurgery             DVT ppx: IV Heparin     Code Status: Full code  Family Communication: I offered to call his family, but patient states that his mother was with him earlier, he said I do not need to call his family.   Disposition Plan:  Anticipate discharge back to previous environment  Consults called: Dr. Juliann Pares of cardiology  Admission status and Level of care: Telemetry Cardiac:   as inpt     Severity of  Illness:  The appropriate patient status for this patient is INPATIENT. Inpatient status is judged to be reasonable and necessary in order to provide the required intensity of service to ensure the patient's safety. The patient's presenting symptoms, physical exam findings, and initial radiographic and laboratory data in the context of their chronic comorbidities is felt to place them at high risk for further clinical deterioration. Furthermore, it is not anticipated that the patient will be medically stable for discharge from the hospital within 2 midnights of admission.   * I certify that at the point of admission it is my clinical judgment that the patient will require inpatient hospital care spanning beyond 2 midnights from the point of admission due to high intensity of service, high risk for further deterioration and high frequency of surveillance required.*       Date of Service 02/12/2022    Lorretta Harp [facility]   If 7PM-7AM, please contact night-coverage [email or web address] 02/12/2022, 3:47 PM

## 2022-02-12 NOTE — ED Provider Notes (Signed)
[facility] Provider Note    Event Date/Time   First MD Initiated Contact with Patient 02/12/22 1257     (approximate)   History   Chest Pain   HPI  Oscar Clark is a 61 y.o. male with a history of hypertension, SVT, and kidney stones who presents with right upper chest and arm pain acutely since last night but intermittently occurring over the last several weeks.  The patient states that is similar to what he presented with in the ED about a month ago.  However, he states that with this episode he became sweaty and weak as well.  He denies any shortness of breath or nausea.  He has no cough or fever.    Physical Exam   Triage Vital Signs: ED Triage Vitals  Enc Vitals Group     BP 02/12/22 1207 (!) 162/90     Pulse Rate 02/12/22 1207 81     Resp 02/12/22 1207 18     Temp 02/12/22 1207 97.8 F (36.6 C)     Temp Source 02/12/22 1207 Oral     SpO2 02/12/22 1207 97 %     Weight 02/12/22 1209 177 lb (80.3 kg)     Height 02/12/22 1209 5\' 9"  (1.753 m)     Head Circumference --      Peak Flow --      Pain Score 02/12/22 1209 0     Pain Loc --      Pain Edu? --      Excl. in GC? --     Most recent vital signs: Vitals:   02/12/22 1330 02/12/22 1400  BP: (!) 161/91 (!) 159/93  Pulse: 74 69  Resp: (!) 22 (!) 25  Temp:    SpO2: 96% 98%     General: Awake, no distress.  CV:  Good peripheral perfusion.  Normal heart sounds. Resp:  Normal effort.  Lungs CTAB. Abd:  No distention.  Other:  No peripheral edema.  Full range of motion right shoulder   ED Results / Procedures / Treatments   Labs (all labs ordered are listed, but only abnormal results are displayed) Labs Reviewed  TROPONIN I (HIGH SENSITIVITY) - Abnormal; Notable for the following components:      Result Value   Troponin I (High Sensitivity) 152 (*)    All other components within normal limits  TROPONIN I (HIGH SENSITIVITY) - Abnormal; Notable for the following components:    Troponin I (High Sensitivity) 420 (*)    All other components within normal limits  CBC WITH DIFFERENTIAL/PLATELET  BRAIN NATRIURETIC PEPTIDE  D-DIMER, QUANTITATIVE  BASIC METABOLIC PANEL  PROTIME-INR  APTT  HEPARIN LEVEL (UNFRACTIONATED)     EKG  ED ECG REPORT I, 02/14/22, the attending physician, personally viewed and interpreted this ECG.  Date: 02/12/2022 EKG Time: 1213 Rate: 82 Rhythm: normal sinus rhythm QRS Axis: normal Intervals: RBBB ST/T Wave abnormalities: Minimal ST depression in 3 and aVF Narrative Interpretation: Nonspecific abnormalities with some new ST changes when compared to EKG of 01/12/2022    RADIOLOGY  Chest x-ray: I independently viewed and interpreted the images; there is no focal consolidation or edema   PROCEDURES:  Critical Care performed: No  Procedures   MEDICATIONS ORDERED IN ED: Medications  nitroGLYCERIN (NITROSTAT) SL tablet 0.4 mg (has no administration in time range)  morphine (PF) 2 MG/ML injection 2 mg (has no administration in time range)  nicotine (NICODERM CQ - dosed in mg/24 hours) patch  21 mg (21 mg Transdermal Patch Applied 02/12/22 1614)  ondansetron (ZOFRAN) injection 4 mg (has no administration in time range)  acetaminophen (TYLENOL) tablet 650 mg (has no administration in time range)  hydrALAZINE (APRESOLINE) injection 5 mg (has no administration in time range)  heparin ADULT infusion 100 units/mL (25000 units/230mL) (has no administration in time range)  heparin bolus via infusion 4,000 Units (has no administration in time range)  aspirin EC tablet 81 mg (has no administration in time range)  atorvastatin (LIPITOR) tablet 40 mg (has no administration in time range)  felodipine (PLENDIL) 24 hr tablet 5 mg (has no administration in time range)  hydrochlorothiazide (HYDRODIURIL) tablet 12.5 mg (has no administration in time range)    And  lisinopril (ZESTRIL) tablet 10 mg (has no administration in time range)   oxyCODONE-acetaminophen (PERCOCET/ROXICET) 5-325 MG per tablet 1 tablet (has no administration in time range)  aspirin chewable tablet 324 mg (324 mg Oral Given 02/12/22 1614)     IMPRESSION / MDM / ASSESSMENT AND PLAN / ED COURSE  I reviewed the triage vital signs and the nursing notes.  61 year old male with PMH as noted above presents with atypical chest pain which has been occurring for a few weeks, with an acute episode since yesterday.  On exam the patient is well-appearing.  His vital signs are normal except for mild hypertension.  The physical exam is otherwise unremarkable for acute findings.  I reviewed the past medical records.  The patient was admitted in May.  Per the hospitalist discharge summary from 01/13/2022 the patient presented with chest pain.  He had minimally elevated troponins at that time.  He had an echo and stress test in the hospital which were both negative and was cleared by cardiology.  Differential diagnosis includes, but is not limited to, ACS, musculoskeletal pain, GERD, other benign etiology.  I have a lower suspicion for PE given the lack of shortness of breath or hypoxia.  I also have a lower suspicion for aortic dissection or other vascular etiology based on the intermittent and subacute nature of the symptoms.  Initial troponin is elevated to 150 which is concerning for ACS.  CBC shows no acute abnormalities.  BNP is normal.  The patient has no significant pain at this time.  Based on the intermittent nature of the symptoms I will not start him on anticoagulation emergently.  I will consult cardiology and plan for admission.  Patient's presentation is most consistent with acute presentation with potential threat to life or bodily function.  The patient is on the cardiac monitor to evaluate for evidence of arrhythmia and/or significant heart rate changes.  ----------------------------------------- 4:18 PM on  02/12/2022 -----------------------------------------  I attempted to consult Dr. Juliann Pares from cardiology although I did not immediately hear back as he is in a procedure.  The patient has not been having active chest pain over the last few hours so there was no indication to initiate a heparin infusion at that time, he has remained clinically stable.  I then consulted Dr. Clyde Lundborg from the hospitalist service.  Based on our discussion he agrees to admit the patient   FINAL CLINICAL IMPRESSION(S) / ED DIAGNOSES   Final diagnoses:  Atypical chest pain  Elevated troponin     Rx / DC Orders   ED Discharge Orders     None        Note:  This document was prepared using Dragon voice recognition software and may include unintentional dictation errors.  Dionne Bucy, MD 02/12/22 1620

## 2022-02-12 NOTE — Assessment & Plan Note (Signed)
-  IV hydralazine as needed -Felodipine and Zestoretic

## 2022-02-12 NOTE — Assessment & Plan Note (Signed)
Recently baseline creatinine 1.10 on 01/14/1972 -Pending BMP

## 2022-02-13 ENCOUNTER — Encounter
Admission: EM | Disposition: A | Discharge status: Other Acute Inpatient Hospital | Payer: Self-pay | Source: Home / Self Care | Attending: Internal Medicine

## 2022-02-13 ENCOUNTER — Encounter: Payer: Self-pay | Admitting: Internal Medicine

## 2022-02-13 DIAGNOSIS — I214 Non-ST elevation (NSTEMI) myocardial infarction: Secondary | ICD-10-CM

## 2022-02-13 HISTORY — PX: LEFT HEART CATH AND CORONARY ANGIOGRAPHY: CATH118249

## 2022-02-13 HISTORY — PX: FOREIGN BODY RETRIEVAL: CATH118241

## 2022-02-13 LAB — TROPONIN I (HIGH SENSITIVITY)
Troponin I (High Sensitivity): 1267 ng/L (ref <18)
Troponin I (High Sensitivity): 1124 ng/L (ref <18)

## 2022-02-13 LAB — HEPARIN LEVEL (UNFRACTIONATED)
Heparin Unfractionated: 0.32 IU/mL (ref 0.30–0.70)
Heparin Unfractionated: 0.3 IU/mL (ref 0.30–0.70)

## 2022-02-13 LAB — CBC
HCT: 46.8 % (ref 39.0–52.0)
Hemoglobin: 15.5 g/dL (ref 13.0–17.0)
MCH: 27.8 pg (ref 26.0–34.0)
MCHC: 33.1 g/dL (ref 30.0–36.0)
MCV: 83.9 fL (ref 80.0–100.0)
Platelets: 272 10*3/uL (ref 150–400)
RBC: 5.58 MIL/uL (ref 4.22–5.81)
RDW: 13.1 % (ref 11.5–15.5)
WBC: 3.8 10*3/uL — ABNORMAL LOW (ref 4.0–10.5)
nRBC: 0 % (ref 0.0–0.2)

## 2022-02-13 SURGERY — LEFT HEART CATH AND CORONARY ANGIOGRAPHY
Anesthesia: Moderate Sedation

## 2022-02-13 MED ORDER — SODIUM CHLORIDE 0.9% FLUSH: 3.0000 mL | INTRAVENOUS | Status: DC | PRN

## 2022-02-13 MED ORDER — SODIUM CHLORIDE 0.9 % WEIGHT BASED INFUSION
3.0000 mL/kg/h | INTRAVENOUS | Status: DC
Start: 2022-02-14 — End: 2022-02-13

## 2022-02-13 MED ORDER — SODIUM CHLORIDE 0.9% FLUSH
3.0000 mL | Freq: Two times a day (BID) | INTRAVENOUS | Status: DC
  Administered 2022-02-14 (×2): 3 mL via INTRAVENOUS

## 2022-02-13 MED ORDER — ASPIRIN 81 MG PO CHEW: 81.0000 mg | CHEWABLE_TABLET | ORAL | Status: DC

## 2022-02-13 MED ORDER — FENTANYL CITRATE (PF) 100 MCG/2ML IJ SOLN
INTRAMUSCULAR | Status: AC
  Filled 2022-02-13: qty 2

## 2022-02-13 MED ORDER — SODIUM CHLORIDE 0.9% FLUSH
3.0000 mL | Freq: Two times a day (BID) | INTRAVENOUS | Status: DC
  Administered 2022-02-13 – 2022-02-14 (×3): 3 mL via INTRAVENOUS

## 2022-02-13 MED ORDER — FENTANYL CITRATE (PF) 100 MCG/2ML IJ SOLN
INTRAMUSCULAR | Status: DC | PRN
Start: 2022-02-13 — End: 2022-02-13
  Administered 2022-02-13: 25 ug via INTRAVENOUS
  Administered 2022-02-13: 50 ug via INTRAVENOUS
  Administered 2022-02-13: 25 ug via INTRAVENOUS

## 2022-02-13 MED ORDER — HYDRALAZINE HCL 20 MG/ML IJ SOLN: 10.0000 mg | INTRAMUSCULAR | Status: AC | PRN

## 2022-02-13 MED ORDER — HEPARIN SODIUM (PORCINE) 1000 UNIT/ML IJ SOLN
INTRAMUSCULAR | Status: AC
  Filled 2022-02-13: qty 10

## 2022-02-13 MED ORDER — LIDOCAINE HCL (PF) 1 % IJ SOLN
INTRAMUSCULAR | Status: DC | PRN
  Administered 2022-02-13: 2 mL

## 2022-02-13 MED ORDER — SODIUM CHLORIDE 0.9 % IV SOLN: 250.0000 mL | INTRAVENOUS | Status: DC | PRN

## 2022-02-13 MED ORDER — ACETAMINOPHEN 325 MG PO TABS
ORAL_TABLET | ORAL | Status: AC
  Filled 2022-02-13: qty 2

## 2022-02-13 MED ORDER — HEPARIN SODIUM (PORCINE) 1000 UNIT/ML IJ SOLN
INTRAMUSCULAR | Status: DC | PRN
  Administered 2022-02-13: 2000 [IU] via INTRAVENOUS
  Administered 2022-02-13: 4000 [IU] via INTRAVENOUS
  Administered 2022-02-13: 2000 [IU] via INTRAVENOUS

## 2022-02-13 MED ORDER — SODIUM CHLORIDE 0.9 % WEIGHT BASED INFUSION: 1.0000 mL/kg/h | INTRAVENOUS | Status: DC

## 2022-02-13 MED ORDER — VERAPAMIL HCL 2.5 MG/ML IV SOLN
INTRAVENOUS | Status: DC | PRN
  Administered 2022-02-13: 2.5 mg via INTRA_ARTERIAL

## 2022-02-13 MED ORDER — MIDAZOLAM HCL 2 MG/2ML IJ SOLN
INTRAMUSCULAR | Status: AC
  Filled 2022-02-13: qty 2

## 2022-02-13 MED ORDER — ONDANSETRON HCL 4 MG/2ML IJ SOLN: 4.0000 mg | Freq: Four times a day (QID) | INTRAMUSCULAR | Status: DC | PRN

## 2022-02-13 MED ORDER — SODIUM CHLORIDE 0.9 % WEIGHT BASED INFUSION
1.0000 mL/kg/h | INTRAVENOUS | Status: AC
  Administered 2022-02-13: 1 mL/kg/h via INTRAVENOUS

## 2022-02-13 MED ORDER — LABETALOL HCL 5 MG/ML IV SOLN: 10.0000 mg | INTRAVENOUS | Status: AC | PRN

## 2022-02-13 MED ORDER — SODIUM CHLORIDE 0.9 % WEIGHT BASED INFUSION
3.0000 mL/kg/h | INTRAVENOUS | Status: DC
  Administered 2022-02-13: 3 mL/kg/h via INTRAVENOUS

## 2022-02-13 MED ORDER — IOHEXOL 300 MG/ML  SOLN
INTRAMUSCULAR | Status: DC | PRN
  Administered 2022-02-13: 108 mL

## 2022-02-13 MED ORDER — MIDAZOLAM HCL 2 MG/2ML IJ SOLN
INTRAMUSCULAR | Status: DC | PRN
  Administered 2022-02-13 (×2): 1 mg via INTRAVENOUS

## 2022-02-13 MED ORDER — ACETAMINOPHEN 325 MG PO TABS: 650.0000 mg | ORAL_TABLET | ORAL | Status: DC | PRN

## 2022-02-13 MED ORDER — HEPARIN (PORCINE) IN NACL 2000-0.9 UNIT/L-% IV SOLN
INTRAVENOUS | Status: DC | PRN
  Administered 2022-02-13: 1000 mL

## 2022-02-13 MED ORDER — METOPROLOL TARTRATE 5 MG/5ML IV SOLN: 2.5000 mg | Freq: Once | INTRAVENOUS | Status: DC

## 2022-02-13 MED ORDER — LIDOCAINE HCL 1 % IJ SOLN
INTRAMUSCULAR | Status: AC
  Filled 2022-02-13: qty 20

## 2022-02-13 MED ORDER — VERAPAMIL HCL 2.5 MG/ML IV SOLN
INTRAVENOUS | Status: AC
  Filled 2022-02-13: qty 2

## 2022-02-13 SURGICAL SUPPLY — 23 items
BAND ZEPHYR COMPRESS 30 LONG (HEMOSTASIS) ×1 IMPLANT
CATH 5FR JL3.5 JR4 ANG PIG MP (CATHETERS) ×1 IMPLANT
CATH INFINITI 5FR JL4 (CATHETERS) ×1 IMPLANT
CATH INFINITI JR4 5F (CATHETERS) ×1 IMPLANT
CATH VISTA GUIDE 6FR JR4 (CATHETERS) ×1 IMPLANT
DEVICE CLOSURE MYNXGRIP 5F (Vascular Products) ×1 IMPLANT
DEVICE ENSNARE  12MMX20MM (VASCULAR PRODUCTS) ×1
DEVICE ENSNARE 12MMX20MM (VASCULAR PRODUCTS) IMPLANT
DRAPE BRACHIAL (DRAPES) ×1 IMPLANT
GLIDESHEATH SLEND SS 6F .021 (SHEATH) ×1 IMPLANT
GUIDEWIRE .025 260CM (WIRE) ×1 IMPLANT
GUIDEWIRE INQWIRE 1.5J.035X260 (WIRE) IMPLANT
INQWIRE 1.5J .035X260CM (WIRE) ×2
KIT ENCORE 26 ADVANTAGE (KITS) ×1 IMPLANT
KIT MICROPUNCTURE NIT STIFF (SHEATH) ×1 IMPLANT
PACK CARDIAC CATH (CUSTOM PROCEDURE TRAY) ×2 IMPLANT
PROTECTION STATION PRESSURIZED (MISCELLANEOUS) ×2
SET ATX SIMPLICITY (MISCELLANEOUS) ×1 IMPLANT
SHEATH AVANTI 6FR X 11CM (SHEATH) ×1 IMPLANT
STATION PROTECTION PRESSURIZED (MISCELLANEOUS) IMPLANT
TUBING CIL FLEX 10 FLL-RA (TUBING) ×1 IMPLANT
WIRE GUIDERIGHT .035X150 (WIRE) ×1 IMPLANT
WIRE HITORQ VERSACORE ST 145CM (WIRE) ×1 IMPLANT

## 2022-02-13 NOTE — Consult Note (Signed)
ANTICOAGULATION CONSULT NOTE  Pharmacy Consult for heparin Indication: chest pain/ACS  No Known Allergies  Patient Measurements: Height: 5\' 9"  (175.3 cm) Weight: 80.5 kg (177 lb 7.5 oz) IBW/kg (Calculated) : 70.7 Heparin Dosing Weight: 80.3kg  Vital Signs: Temp: 98.1 F (36.7 C) (06/19 1133) Temp Source: Oral (06/19 0736) BP: 160/103 (06/19 1133) Pulse Rate: 63 (06/19 1133)  Labs: Recent Labs    02/12/22 1211 02/12/22 1403 02/12/22 1731 02/12/22 2209 02/13/22 0040 02/13/22 0503 02/13/22 1158  HGB 16.0  --   --   --   --  15.5  --   HCT 48.9  --   --   --   --  46.8  --   PLT 294  --   --   --   --  272  --   APTT  --  30  --   --   --   --   --   LABPROT  --  13.5  --   --   --   --   --   INR  --  1.0  --   --   --   --   --   HEPARINUNFRC  --   --   --  0.41  --  0.32 0.30  CREATININE  --   --  1.29*  --   --   --   --   TROPONINIHS 152* 420* 955* 1,398* 1,267* 1,124*  --      Estimated Creatinine Clearance: 60.1 mL/min (A) (by C-G formula based on SCr of 1.29 mg/dL (H)).   Medical History: Past Medical History:  Diagnosis Date   Hypertension     Medications:  PTA: Clopidogrel 75mg  QD, Aspirin 81mg  QD Inpatient: Heparin infusion (6/18 >>>) Allergies: NKDA  Assessment: Oscar Clark is a 61 y.o. male with medical history significant of hypertension, hyperlipidemia, SVT, tobacco abuse, CKD-2, recent diagnosis of stroke, who presents with chest pain, right arm tingling. Pharmacy consulted for management of heparin drip in setting of ACS/STEMI.  6/18 CT head: No acute intracranial hemorrhage, mass effect, or herniation  Goal of Therapy:  Heparin level 0.3-0.7 units/ml Monitor platelets by anticoagulation protocol: Yes  Date Time HL   Rate/Comment 6/18 2209 0.41 Therapeutic 6/19 0503 0.32 Therapeutic (trending down- rate changed) 6/19 1158 0.30 Therapeutic x 1 current rate  Plan:  Continue heparin infusion at 1100 units/hr Recheck HL in 6 hrs to  confirm Continue to monitor H&H and platelets daily while on heparin gtt.  Paullette Mckain Rodriguez-Guzman PharmD, BCPS 02/13/2022 1:05 PM

## 2022-02-13 NOTE — Hospital Course (Addendum)
61 y.o. male with medical history significant of hypertension, hyperlipidemia, SVT, tobacco abuse, CKD-2, recent diagnosis of stroke, who presented on 02/12/2022 for evaluation of chest pain, right arm tingling.  Patient was admitted from 5/18 through 01/13/2022 for intermittent chest pain intermittent blurry vision and right arm numbness.  Troponin was minimally elevated at 34, cardiology consulted and patient underwent Myoview stress test with poor tracer uptake, echocardiogram was reassuring.  MRI brain at admission showed a small ischemic infarct in the right medial temporal lobe felt to be incidental per neurology.  CTA of head and neck showed no large vessel occlusions.  He was discharged on aspirin Plavix and Lipitor.  This presentation, patient reported ongoing intermittent chest pain with radiation down the right arm with ongoing right arm numbness and tingling and headache.  Initial troponin in the ED 152, trended upward and peaked at 1398.  EKG showed ST depression in the inferior leads.  Started on heparin infusion with cardiology consulted.  Cardiac cath is planned.  CT cervical spine showed advanced multilevel cervical spondylosis, most significant at the C5-6 and C6-7 levels, multilevel foraminal stenosis, no high-grade central canal stenosis.

## 2022-02-13 NOTE — Progress Notes (Signed)
Patient is on a heart healthy diet.  Patient will be NPO as of midnight 00:01 am on 06/19/ 2023 for Cath and PCI in the morning.  Patient arrived on the floor at 9:30 pm. Alert and oriented. Skin in tact. Able to ambulate. Takes pills whole with water this shift. Heparin drip going at 10 ml/hr.  One IV present in Right Forearm. Second peripheral IV placed in Left forearm pending Cath in the morning.  Informed consent signed and placed in patient's chart

## 2022-02-13 NOTE — Consult Note (Addendum)
ANTICOAGULATION CONSULT NOTE  Pharmacy Consult for heparin Indication: chest pain/ACS  No Known Allergies  Patient Measurements: Height: 5\' 9"  (175.3 cm) Weight: 80.5 kg (177 lb 7.5 oz) IBW/kg (Calculated) : 70.7 Heparin Dosing Weight: 80.3kg  Vital Signs: Temp: 98.1 F (36.7 C) (06/19 0400) Temp Source: Oral (06/19 0400) BP: 151/80 (06/19 0400) Pulse Rate: 67 (06/19 0400)  Labs: Recent Labs    02/12/22 1211 02/12/22 1403 02/12/22 1731 02/12/22 2209 02/13/22 0040 02/13/22 0503  HGB 16.0  --   --   --   --  15.5  HCT 48.9  --   --   --   --  46.8  PLT 294  --   --   --   --  272  APTT  --  30  --   --   --   --   LABPROT  --  13.5  --   --   --   --   INR  --  1.0  --   --   --   --   HEPARINUNFRC  --   --   --  0.41  --  0.32  CREATININE  --   --  1.29*  --   --   --   TROPONINIHS 152* 420* 955* 1,398* 1,267* 1,124*     Estimated Creatinine Clearance: 60.1 mL/min (A) (by C-G formula based on SCr of 1.29 mg/dL (H)).   Medical History: Past Medical History:  Diagnosis Date   Hypertension     Medications:  PTA: Clopidogrel 75mg  QD, Aspirin 81mg  QD Inpatient: Heparin infusion (6/18 >>>) Allergies: NKDA  Assessment: Oscar Clark is a 61 y.o. male with medical history significant of hypertension, hyperlipidemia, SVT, tobacco abuse, CKD-2, recent diagnosis of stroke, who presents with chest pain, right arm tingling. Pharmacy consulted for management of heparin drip in setting of ACS/STEMI.  6/18 CT head: No acute intracranial hemorrhage, mass effect, or herniation  Goal of Therapy:  Heparin level 0.3-0.7 units/ml Monitor platelets by anticoagulation protocol: Yes  Date Time HL   Rate/Comment 6/18 2209 0.41 Therapeutic x 1 6/19 0503 0.32 Therapeutic, trending down  Plan:  Increase heparin infusion slightly to 1100 units/hr Recheck HL in 6 hrs to reconfirm Continue to monitor H&H and platelets daily while on heparin gtt. Pt has Left Heart Cath planned  for later today at unknown time.  7/18, PharmD, [facility] 02/13/2022 5:54 AM

## 2022-02-13 NOTE — Progress Notes (Signed)
Patient's pre-cath aspirin available. Weight is 80.5 kg this morning.

## 2022-02-13 NOTE — Progress Notes (Signed)
Patient's heparin dose has been changed from 10 ml/hr to 11 ml/hr

## 2022-02-13 NOTE — Progress Notes (Signed)
Progress Note   Patient: Oscar Clark [id number]:[id number] DOB: 1960/10/21 DOA: 02/12/2022     1 DOS: the patient was seen and examined on 02/13/2022   Brief hospital course:  61 y.o. male with medical history significant of hypertension, hyperlipidemia, SVT, tobacco abuse, CKD-2, recent diagnosis of stroke, who presented on 02/12/2022 for evaluation of chest pain, right arm tingling.  Patient was admitted from 5/18 through 01/13/2022 for intermittent chest pain intermittent blurry vision and right arm numbness.  Troponin was minimally elevated at 34, cardiology consulted and patient underwent Myoview stress test with poor tracer uptake, echocardiogram was reassuring.  MRI brain at admission showed a small ischemic infarct in the right medial temporal lobe felt to be incidental per neurology.  CTA of head and neck showed no large vessel occlusions.  He was discharged on aspirin Plavix and Lipitor.  This presentation, patient reported ongoing intermittent chest pain with radiation down the right arm with ongoing right arm numbness and tingling and headache.  Initial troponin in the ED 152, trended upward and peaked at 1398.  EKG showed ST depression in the inferior leads.  Started on heparin infusion with cardiology consulted.  Cardiac cath is planned.  CT cervical spine showed advanced multilevel cervical spondylosis, most significant at the C5-6 and C6-7 levels, multilevel foraminal stenosis, no high-grade central canal stenosis.  Assessment and Plan: * NSTEMI (non-ST elevated myocardial infarction) (HCC) Trop 152 --> 420 --> peaked at 1398 and down trended from there.  Still having intermittent chest pain at time of admission. 6/19: Patient denies chest pain this morning -- [facility] cardiology following -- Plan for cardiac cath today -- Continue heparin drip --Continue aspirin and Lipitor -- Continue as needed nitro, morphine -- Will need beta-blocker --Telemetry   Essential hypertension -IV  hydralazine as needed -Felodipine and Zestoretic  CKD (chronic kidney disease), stage II Recently baseline creatinine 1.10 (5/19). Creatinine 1.29 on admission, near baseline.   -- Monitor BMP  HLD (hyperlipidemia) - Lipitor  Stroke [facility]) Recently diagnosed with a small stroke -Continue aspirin and Lipitor   Numbness and tingling of right arm CT scan of C spine showed advanced multilevel cervical spondylosis, most pronounced at C5-6 and C6-7. No evidence of high-grade canal stenosis by CT. Findings of foraminal stenosis at multiple levels including bilaterally at C4-5 and C5-6, and right-sided at C6-7. -prn percocet and tylenol  -may need to f/u with neurosurgery  Tobacco abuse Tobacco cessation counseling Clean all indoor clothing, sheets, blankets, and freshen textile furniture to rid the smell of cigarettes Only smoke outside and wear outer covering Leave cigarettes and lighters outside in separate places During in-between cigarettes, if you feel the urge to smoke, use the following: stress squeezing devices/phone a trusted friend to talk you through the urge/walk in a safe environment Avoid prolong interactions with individuals actively smoking cigarettes If you smoke in social setting, avoid social setting where cigarette smoking is expected or considered acceptable  If missing the feeling of holding a cigarette, cut a sipping straw to the length of a cigarette and hold it between your fingers Call 1800 QUIT NOW if in need of nicotine patches to help with cessation         Subjective: Patient awake resting in bed when seen this morning before going to the Cath Lab.  He denies having chest pain palpitations shortness of breath or any other acute complaints.  Physical Exam: Vitals:   02/13/22 0230 02/13/22 0400 02/13/22 0736 02/13/22 1133  BP:  (!) 151/80 Marland Kitchen)  163/98 (!) 160/103  Pulse:  67 65 63  Resp:  20 15 16   Temp:  98.1 F (36.7 C) 98.2 F (36.8 C) 98.1 F  (36.7 C)  TempSrc:  Oral Oral   SpO2:  99% 98% 99%  Weight: 80.5 kg     Height:       General exam: awake, alert, no acute distress HEENT: atraumatic, clear conjunctiva, anicteric sclera, moist mucus membranes, hearing grossly normal  Respiratory system: CTAB, no wheezes, rales or rhonchi, normal respiratory effort. Cardiovascular system: normal S1/S2, RRR, no JVD, murmurs, rubs, gallops,  no pedal edema.   Gastrointestinal system: soft, NT, ND, no HSM felt, +bowel sounds. Central nervous system: A&O x3. no gross focal neurologic deficits, normal speech Extremities: moves all, no edema, normal tone Skin: dry, intact, normal temperature, normal color, No rashes, lesions or ulcers Psychiatry: normal mood, congruent affect, judgement and insight appear normal   Data Reviewed:  Notable labs: Troponin peaked 1398 10 down trended to 1267, 1124.  BMP with chloride 114, bicarb 17, glucose 143, BUN 31, creatinine 1.29, calcium 8.8.  CBC unremarkable except for mildly low WBC 3.8  Family Communication: None  Disposition: Status is: Inpatient Remains inpatient appropriate because: Ongoing evaluation not appropriate for the outpatient setting, cardiac cath planned for today and on IV heparin   Planned Discharge Destination: Home    Time spent: 40 minutes  Author: , DO 02/13/2022 2:20 PM  For on call review [email or web address].

## 2022-02-13 NOTE — TOC Initial Note (Signed)
Transition of Care [facility]) - Initial/Assessment Note    Patient Details  Name: Oscar Clark MRN: [id number] Date of Birth: 08/13/1961  Transition of Care [facility]) CM/SW Contact:    Truddie Hidden, RN Phone Number: 02/13/2022, 1:02 PM  Clinical Narrative:                  Transition of Care [facility]) Screening Note   Patient Details  Name: Oscar Clark Date of Birth: Aug 04, 1961   Transition of Care [facility]) CM/SW Contact:    Truddie Hidden, RN Phone Number: 02/13/2022, 1:02 PM    Transition of Care Department [facility]) has reviewed patient and no TOC needs have been identified at this time. We will continue to monitor patient advancement through interdisciplinary progression rounds. If new patient transition needs arise, please place a TOC consult.          Patient Goals and CMS Choice        Expected Discharge Plan and Services                                                Prior Living Arrangements/Services                       Activities of Daily Living Home Assistive Devices/Equipment: None ADL Screening (condition at time of admission) Patient's cognitive ability adequate to safely complete daily activities?: Yes Is the patient deaf or have difficulty hearing?: No Does the patient have difficulty seeing, even when wearing glasses/contacts?: No Does the patient have difficulty concentrating, remembering, or making decisions?: No Patient able to express need for assistance with ADLs?: Yes Does the patient have difficulty dressing or bathing?: No Independently performs ADLs?: Yes (appropriate for developmental age) Does the patient have difficulty walking or climbing stairs?: No Weakness of Legs: None Weakness of Arms/Hands: None  Permission Sought/Granted                  Emotional Assessment              Admission diagnosis:  Atypical chest pain [R07.89] Elevated troponin [R77.8] NSTEMI (non-ST elevated myocardial infarction)  (HCC) [I21.4] Chest pain [R07.9] Patient Active Problem List   Diagnosis Date Noted   NSTEMI (non-ST elevated myocardial infarction) (HCC) 02/12/2022   Stroke (HCC) 02/12/2022   CKD (chronic kidney disease), stage II 02/12/2022   Numbness and tingling of right arm 02/12/2022   HLD (hyperlipidemia) 02/12/2022   History of CVA in adulthood 01/24/2022   Elevated troponin 01/12/2022   Right arm numbness 01/12/2022   Tobacco abuse 01/12/2022   Acute renal failure superimposed on stage 2 chronic kidney disease (HCC) 01/12/2022   Erectile dysfunction 01/07/2022   Mass of kidney 01/07/2022   Tachycardia with hypertension 01/05/2022   Asymptomatic hypertensive urgency 01/05/2022   SVT (supraventricular tachycardia) (HCC) 03/16/2019   Kidney stone 06/11/2015    Class: History of   Muscle spasm 06/11/2015   Essential hypertension 03/25/2014   PCP:  [name], PA-C Pharmacy:   [facility] [address], [place] - [address] [place] [address] Phone: [phone] Fax: [phone]     Social Determinants of Health (SDOH) Interventions    Readmission Risk Interventions     No data to display

## 2022-02-13 NOTE — Assessment & Plan Note (Signed)
Tobacco cessation counseling 1. Clean all indoor clothing, sheets, blankets, and freshen textile furniture to rid the smell of cigarettes 2. Only smoke outside and wear outer covering 3. Leave cigarettes and lighters outside in separate places 4. During in-between cigarettes, if you feel the urge to smoke, use the following: stress squeezing devices/phone a trusted friend to talk you through the urge/walk in a safe environment 5. Avoid prolong interactions with individuals actively smoking cigarettes 6. If you smoke in social setting, avoid social setting where cigarette smoking is expected or considered acceptable  7. If missing the feeling of holding a cigarette, cut a sipping straw to the length of a cigarette and hold it between your fingers 8. Call 1800 QUIT NOW if in need of nicotine patches to help with cessation

## 2022-02-13 NOTE — Progress Notes (Signed)
[facility] Cardiology    SUBJECTIVE: Patient states improved chest pain discomfort feels much better recent hospitalization 1 month ago now returns again with similar complaints for further evaluation states disease we will proceed with a cardiac cath   Vitals:   02/13/22 0230 02/13/22 0400 02/13/22 0736 02/13/22 1133  BP:  (!) 151/80 (!) 163/98 (!) 160/103  Pulse:  67 65 63  Resp:  20 15 16   Temp:  98.1 F (36.7 C) 98.2 F (36.8 C) 98.1 F (36.7 C)  TempSrc:  Oral Oral   SpO2:  99% 98% 99%  Weight: 80.5 kg     Height:         Intake/Output Summary (Last 24 hours) at 02/13/2022 1200 Last data filed at 02/13/2022 0304 Gross per 24 hour  Intake 143.11 ml  Output --  Net 143.11 ml      PHYSICAL EXAM  General: Well developed, well nourished, in no acute distress HEENT:  Normocephalic and atramatic Neck:  No JVD.  Lungs: Clear bilaterally to auscultation and percussion. Heart: HRRR . Normal S1 and S2 without gallops or murmurs.  Abdomen: Bowel sounds are positive, abdomen soft and non-tender  Msk:  Back normal, normal gait. Normal strength and tone for age. Extremities: No clubbing, cyanosis or edema.   Neuro: Alert and oriented X 3. Psych:  Good affect, responds appropriately   LABS: Basic Metabolic Panel: Recent Labs    02/12/22 1731  NA 138  K 4.1  CL 114*  CO2 17*  GLUCOSE 143*  BUN 31*  CREATININE 1.29*  CALCIUM 8.8*   Liver Function Tests: No results for input(s): "AST", "ALT", "ALKPHOS", "BILITOT", "PROT", "ALBUMIN" in the last 72 hours. No results for input(s): "LIPASE", "AMYLASE" in the last 72 hours. CBC: Recent Labs    02/12/22 1211 02/13/22 0503  WBC 5.2 3.8*  NEUTROABS 3.9  --   HGB 16.0 15.5  HCT 48.9 46.8  MCV 84.7 83.9  PLT 294 272   Cardiac Enzymes: No results for input(s): "CKTOTAL", "CKMB", "CKMBINDEX", "TROPONINI" in the last 72 hours. BNP: Invalid input(s): "POCBNP" D-Dimer: Recent Labs    02/12/22 1403  DDIMER 0.31    Hemoglobin A1C: No results for input(s): "HGBA1C" in the last 72 hours. Fasting Lipid Panel: No results for input(s): "CHOL", "HDL", "LDLCALC", "TRIG", "CHOLHDL", "LDLDIRECT" in the last 72 hours. Thyroid Function Tests: No results for input(s): "TSH", "T4TOTAL", "T3FREE", "THYROIDAB" in the last 72 hours.  Invalid input(s): "FREET3" Anemia Panel: No results for input(s): "VITAMINB12", "FOLATE", "FERRITIN", "TIBC", "IRON", "RETICCTPCT" in the last 72 hours.  CT HEAD WO CONTRAST (02/14/22)  Result Date: 02/12/2022 CLINICAL DATA:  Neuro deficit, weakness EXAM: CT HEAD WITHOUT CONTRAST TECHNIQUE: Contiguous axial images were obtained from the base of the skull through the vertex without intravenous contrast. RADIATION DOSE REDUCTION: This exam was performed according to the departmental dose-optimization program which includes automated exposure control, adjustment of the mA and/or kV according to patient size and/or use of iterative reconstruction technique. COMPARISON:  CT head 01/13/2022 FINDINGS: Brain: No acute intracranial hemorrhage, mass effect, or herniation. No extra-axial fluid collections. No evidence of acute territorial infarct. No hydrocephalus. Mild cortical volume loss. Mild patchy hypodensities in the periventricular and subcortical white matter, likely secondary to chronic microvascular ischemic changes. Vascular: No hyperdense vessel or unexpected calcification. Skull: Normal. Negative for fracture or focal lesion. Sinuses/Orbits: No acute finding. Other: None. IMPRESSION: Chronic changes with no acute intracranial process identified. Electronically Signed   By: 01/15/2022.D.  On: 02/12/2022 15:59   DG Chest 2 View  Result Date: 02/12/2022 CLINICAL DATA:  Chest pain EXAM: CHEST - 2 VIEW COMPARISON:  Chest x-ray dated Jan 04, 2022 FINDINGS: Cardiac and mediastinal contours are within normal limits. Mild elevation of the left hemidiaphragm. Mild left basilar opacity, likely  due to atelectasis. Lungs otherwise clear. No pleural effusion or pneumothorax. IMPRESSION: No acute cardiopulmonary disease. Electronically Signed   By: Allegra Lai M.D.   On: 02/12/2022 14:40   CT Cervical Spine Wo Contrast  Result Date: 02/12/2022 CLINICAL DATA:  Right-sided cervical radiculopathy EXAM: CT CERVICAL SPINE WITHOUT CONTRAST TECHNIQUE: Multidetector CT imaging of the cervical spine was performed without intravenous contrast. Multiplanar CT image reconstructions were also generated. RADIATION DOSE REDUCTION: This exam was performed according to the departmental dose-optimization program which includes automated exposure control, adjustment of the mA and/or kV according to patient size and/or use of iterative reconstruction technique. COMPARISON:  01/13/2022 FINDINGS: Alignment: Facet joints are aligned without dislocation or traumatic listhesis. Dens and lateral masses are aligned. Slight reversal of the cervical lordosis. Grade 1 anterolisthesis of C7 on T1. Skull base and vertebrae: No acute fracture. No primary bone lesion or focal pathologic process. Soft tissues and spinal canal: No prevertebral fluid or swelling. No visible canal hematoma. Disc levels: Advanced degenerative disc disease at C5-6 and C6-7 and to a slightly lesser degree at C4-5. Multilevel bilateral facet arthropathy is more pronounced on the left. Findings of foraminal stenosis at multiple levels including bilaterally at C4-5, C5-6, and right-sided at C6-7. No evidence of high-grade canal stenosis by CT. Upper chest: Negative. Other: None. IMPRESSION: 1. No acute osseous abnormality of the cervical spine. 2. Advanced multilevel cervical spondylosis, most pronounced at C5-6 and C6-7. No evidence of high-grade canal stenosis by CT. 3. Findings of foraminal stenosis at multiple levels including bilaterally at C4-5 and C5-6, and right-sided at C6-7. Electronically Signed   By: Duanne Guess D.O.   On: 02/12/2022 14:19      Echo preserved left ventricular function 55 to 60% 01/14/2017  TELEMETRY: Sinus rhythm right bundle branch block diffuse nonspecific ST-T wave changes rate of 80:  ASSESSMENT AND PLAN:  Principal Problem:   NSTEMI (non-ST elevated myocardial infarction) (HCC) Active Problems:   Essential hypertension   Tobacco abuse   Stroke (HCC)   CKD (chronic kidney disease), stage II   Numbness and tingling of right arm   HLD (hyperlipidemia)    Plan Agree with admission to telemetry follow-up EKGs and troponin Patient had negative noninvasive equivocal Myoview but now has significant recurrent symptoms and positive enzymes Recommend proceed with cardiac cath prior to discharge Recommend aspirin Plavix statin beta-blocker ARB Advised patient refrain from tobacco abuse Maintain hypertension management and control Hyperlipidemia consider Lipitor or Crestor  Alwyn Pea, MD, 02/13/2022 12:00 PM

## 2022-02-14 DIAGNOSIS — I214 Non-ST elevation (NSTEMI) myocardial infarction: Secondary | ICD-10-CM | POA: Diagnosis not present

## 2022-02-14 LAB — MAGNESIUM: Magnesium: 2 mg/dL (ref 1.7–2.4)

## 2022-02-14 LAB — BASIC METABOLIC PANEL
Anion gap: 6 (ref 5–15)
BUN: 22 mg/dL (ref 8–23)
CO2: 21 mmol/L — ABNORMAL LOW (ref 22–32)
Calcium: 9.6 mg/dL (ref 8.9–10.3)
Chloride: 111 mmol/L (ref 98–111)
Creatinine, Ser: 1.17 mg/dL (ref 0.61–1.24)
GFR, Estimated: >60 mL/min (ref >60)
Glucose, Bld: 101 mg/dL — ABNORMAL HIGH (ref 70–99)
Potassium: 4.2 mmol/L (ref 3.5–5.1)
Sodium: 138 mmol/L (ref 135–145)

## 2022-02-14 MED ORDER — METOPROLOL SUCCINATE ER 25 MG PO TB24
25.0000 mg | ORAL_TABLET | Freq: Every day | ORAL | Status: DC
  Administered 2022-02-14: 25 mg via ORAL
  Filled 2022-02-14: qty 1

## 2022-02-14 MED ORDER — HYDROCHLOROTHIAZIDE 12.5 MG PO TABS: 12.5000 mg | ORAL_TABLET | Freq: Every day | ORAL | Status: DC

## 2022-02-14 MED ORDER — ATORVASTATIN CALCIUM 80 MG PO TABS: 80.0000 mg | ORAL_TABLET | Freq: Every day | ORAL | Status: DC

## 2022-02-14 MED ORDER — LISINOPRIL 20 MG PO TABS: 20.0000 mg | ORAL_TABLET | Freq: Every day | ORAL | Status: DC

## 2022-02-14 NOTE — Progress Notes (Addendum)
Progress Note   Patient: Oscar Clark URK:[id number] DOB: Mar 14, 1961 DOA: 02/12/2022     2 DOS: the patient was seen and examined on 02/14/2022   Brief hospital course:  61 y.o. male with medical history significant of hypertension, hyperlipidemia, SVT, tobacco abuse, CKD-2, recent diagnosis of stroke, who presented on 02/12/2022 for evaluation of chest pain, right arm tingling.  Patient was admitted from 5/18 through 01/13/2022 for intermittent chest pain intermittent blurry vision and right arm numbness.  Troponin was minimally elevated at 34, cardiology consulted and patient underwent Myoview stress test with poor tracer uptake, echocardiogram was reassuring.  MRI brain at admission showed a small ischemic infarct in the right medial temporal lobe felt to be incidental per neurology.  CTA of head and neck showed no large vessel occlusions.  He was discharged on aspirin Plavix and Lipitor.  This presentation, patient reported ongoing intermittent chest pain with radiation down the right arm with ongoing right arm numbness and tingling and headache.  Initial troponin in the ED 152, trended upward and peaked at 1398.  EKG showed ST depression in the inferior leads.  Started on heparin infusion with cardiology consulted.  Cardiac cath is planned.  CT cervical spine showed advanced multilevel cervical spondylosis, most significant at the C5-6 and C6-7 levels, multilevel foraminal stenosis, no high-grade central canal stenosis.  Assessment and Plan: * NSTEMI (non-ST elevated myocardial infarction) (HCC) Trop 152 --> 420 --> peaked at 1398 and down trended from there.  Still having intermittent chest pain at time of admission. 6/19: Patient denies chest pain this morning 6/20: Cath done, cardiology's plan pending for today (?transferring?). No chest pain. -- [facility] cardiology following -- s/p cardiac cath yesterday -- Off heparin drip --Continue aspirin and Lipitor -- Continue as needed nitro,  morphine -- Started on Toprol-XL --Telemetry   Essential hypertension -IV hydralazine as needed -Felodipine and Zestoretic  CKD (chronic kidney disease), stage II Recently baseline creatinine 1.10 (5/19). Creatinine 1.29 on admission, near baseline.   -- Monitor BMP  HLD (hyperlipidemia) - Lipitor  History of stroke Recently diagnosed with a small stroke in May. No acute neurologic issues this admission. -Continue aspirin and Lipitor   Numbness and tingling of right arm CT scan of C spine showed advanced multilevel cervical spondylosis, most pronounced at C5-6 and C6-7. No evidence of high-grade canal stenosis by CT. Findings of foraminal stenosis at multiple levels including bilaterally at C4-5 and C5-6, and right-sided at C6-7. -prn percocet and tylenol  -may need to f/u with neurosurgery  Tobacco abuse Counseled regarding importance of cessation.         Subjective: Patient denies recurrent chest pain.  He is asking what the plan is, had not been seen by cardiology before my encounter.  He has no acute complaints.   Physical Exam: Vitals:   02/14/22 0451 02/14/22 0731 02/14/22 1410 02/14/22 1721  BP: (!) 157/96 (!) 149/94 (!) 144/84 (!) 152/102  Pulse: 71 65 95 74  Resp: 18 17 19 20   Temp: 98.3 F (36.8 C) 98.1 F (36.7 C) 98.4 F (36.9 C) 97.7 F (36.5 C)  TempSrc:   Oral Oral  SpO2: 100% 97% 98% 98%  Weight:      Height:       General exam: awake, alert, no acute distress HEENT: moist mucus membranes, hearing grossly normal  Respiratory system: om room air, normal respiratory effort. Cardiovascular system: RRR, no pedal edema.   Central nervous system: A&O x3. no gross focal neurologic deficits, normal  speech Extremities: moves all, no edema, normal tone Skin: dry, intact, normal temperature Psychiatry: normal mood, congruent affect, judgement and insight appear normal   Data Reviewed:  Notable labs: CO2 21, glucose 101   Family  Communication: None  Disposition: Status is: Inpatient Remains inpatient appropriate because: Pending cardiology recommendations & clearance   Planned Discharge Destination: Home    Time spent: 35 minutes  Author: Pennie Banter, DO 02/14/2022 6:03 PM  For on call review [email or web address].

## 2022-02-14 NOTE — Progress Notes (Signed)
[facility] Cardiology    SUBJECTIVE: Patient resting comfortably no further anginal symptoms no shortness of breath has agreed to transfer to [facility] once bed is available for cardiac intervention.   Vitals:   02/14/22 0451 02/14/22 0731 02/14/22 1410 02/14/22 1721  BP: (!) 157/96 (!) 149/94 (!) 144/84 (!) 152/102  Pulse: 71 65 95 74  Resp: 18 17 19 20   Temp: 98.3 F (36.8 C) 98.1 F (36.7 C) 98.4 F (36.9 C) 97.7 F (36.5 C)  TempSrc:   Oral Oral  SpO2: 100% 97% 98% 98%  Weight:      Height:         Intake/Output Summary (Last 24 hours) at 02/14/2022 1753 Last data filed at 02/14/2022 0900 Gross per 24 hour  Intake 1390.83 ml  Output 1650 ml  Net -259.17 ml      PHYSICAL EXAM  General: Well developed, well nourished, in no acute distress HEENT:  Normocephalic and atramatic Neck:  No JVD.  Lungs: Clear bilaterally to auscultation and percussion. Heart: HRRR . Normal S1 and S2 without gallops or murmurs.  Abdomen: Bowel sounds are positive, abdomen soft and non-tender  Msk:  Back normal, normal gait. Normal strength and tone for age. Extremities: No clubbing, cyanosis or edema.   Neuro: Alert and oriented X 3. Psych:  Good affect, responds appropriately   LABS: Basic Metabolic Panel: Recent Labs    02/12/22 1731 02/14/22 0623  NA 138 138  K 4.1 4.2  CL 114* 111  CO2 17* 21*  GLUCOSE 143* 101*  BUN 31* 22  CREATININE 1.29* 1.17  CALCIUM 8.8* 9.6  MG  --  2.0   Liver Function Tests: No results for input(s): "AST", "ALT", "ALKPHOS", "BILITOT", "PROT", "ALBUMIN" in the last 72 hours. No results for input(s): "LIPASE", "AMYLASE" in the last 72 hours. CBC: Recent Labs    02/12/22 1211 02/13/22 0503  WBC 5.2 3.8*  NEUTROABS 3.9  --   HGB 16.0 15.5  HCT 48.9 46.8  MCV 84.7 83.9  PLT 294 272   Cardiac Enzymes: No results for input(s): "CKTOTAL", "CKMB", "CKMBINDEX", "TROPONINI" in the last 72 hours. BNP: Invalid input(s): "POCBNP" D-Dimer: Recent Labs     02/12/22 1403  DDIMER 0.31   Hemoglobin A1C: No results for input(s): "HGBA1C" in the last 72 hours. Fasting Lipid Panel: No results for input(s): "CHOL", "HDL", "LDLCALC", "TRIG", "CHOLHDL", "LDLDIRECT" in the last 72 hours. Thyroid Function Tests: No results for input(s): "TSH", "T4TOTAL", "T3FREE", "THYROIDAB" in the last 72 hours.  Invalid input(s): "FREET3" Anemia Panel: No results for input(s): "VITAMINB12", "FOLATE", "FERRITIN", "TIBC", "IRON", "RETICCTPCT" in the last 72 hours.  PERIPHERAL VASCULAR CATHETERIZATION  Result Date: 02/13/2022 Successful retrieval of a kinked diagnostic cardiac catheter utilizing a snare via the right common femoral artery to straighten the catheter.     Echo preserved left ventricular function previous echo 60%  TELEMETRY: Normal sinus rhythm nonspecific T wave changes rate of 70:  ASSESSMENT AND PLAN:  Principal Problem:   NSTEMI (non-ST elevated myocardial infarction) (HCC) Active Problems:   Essential hypertension   Tobacco abuse   Stroke (HCC)   CKD (chronic kidney disease), stage II   Numbness and tingling of right arm   HLD (hyperlipidemia)    Plan Status post non-STEMI improved symptoms still on telemetry follow-up EKGs troponins recommend transfer to [facility] for possible complex PCI versus coronary bypass surgery Multivessel coronary disease high-grade lesion in ramus as well as ostial disease LAD ramus recommend aggressive medical therapy but also  pursue intervention versus CABG at [facility] Hyperlipidemia recommend statin therapy Advised patient refrain from tobacco abuse Previous CVA continue aspirin blood pressure control statin therapy Patient ultimately accepted by Dr. Alla German attending [facility] cardiology   Alwyn Pea, MD 02/14/2022 5:53 PM

## 2022-02-15 ENCOUNTER — Encounter: Payer: Self-pay | Admitting: Internal Medicine

## 2022-02-15 DIAGNOSIS — H538 Other visual disturbances: Secondary | ICD-10-CM | POA: Diagnosis not present

## 2022-02-15 DIAGNOSIS — Z8673 Personal history of transient ischemic attack (TIA), and cerebral infarction without residual deficits: Secondary | ICD-10-CM | POA: Diagnosis not present

## 2022-02-15 DIAGNOSIS — I129 Hypertensive chronic kidney disease with stage 1 through stage 4 chronic kidney disease, or unspecified chronic kidney disease: Secondary | ICD-10-CM | POA: Diagnosis not present

## 2022-02-15 DIAGNOSIS — I214 Non-ST elevation (NSTEMI) myocardial infarction: Secondary | ICD-10-CM | POA: Diagnosis not present

## 2022-02-15 DIAGNOSIS — F17210 Nicotine dependence, cigarettes, uncomplicated: Secondary | ICD-10-CM | POA: Diagnosis not present

## 2022-02-15 DIAGNOSIS — E785 Hyperlipidemia, unspecified: Secondary | ICD-10-CM | POA: Diagnosis not present

## 2022-02-15 DIAGNOSIS — F17203 Nicotine dependence unspecified, with withdrawal: Secondary | ICD-10-CM | POA: Diagnosis not present

## 2022-02-15 DIAGNOSIS — R1031 Right lower quadrant pain: Secondary | ICD-10-CM

## 2022-02-15 DIAGNOSIS — E1122 Type 2 diabetes mellitus with diabetic chronic kidney disease: Secondary | ICD-10-CM | POA: Diagnosis not present

## 2022-02-15 DIAGNOSIS — I10 Essential (primary) hypertension: Secondary | ICD-10-CM | POA: Diagnosis not present

## 2022-02-15 DIAGNOSIS — M4802 Spinal stenosis, cervical region: Secondary | ICD-10-CM | POA: Diagnosis not present

## 2022-02-15 DIAGNOSIS — Z7982 Long term (current) use of aspirin: Secondary | ICD-10-CM | POA: Diagnosis not present

## 2022-02-15 DIAGNOSIS — Z720 Tobacco use: Secondary | ICD-10-CM

## 2022-02-15 DIAGNOSIS — I2510 Atherosclerotic heart disease of native coronary artery without angina pectoris: Secondary | ICD-10-CM | POA: Diagnosis not present

## 2022-02-15 DIAGNOSIS — Z7902 Long term (current) use of antithrombotics/antiplatelets: Secondary | ICD-10-CM | POA: Diagnosis not present

## 2022-02-15 DIAGNOSIS — N182 Chronic kidney disease, stage 2 (mild): Secondary | ICD-10-CM | POA: Diagnosis not present

## 2022-02-15 DIAGNOSIS — Z955 Presence of coronary angioplasty implant and graft: Secondary | ICD-10-CM | POA: Diagnosis not present

## 2022-02-15 LAB — BASIC METABOLIC PANEL
Anion gap: 7 (ref 5–15)
BUN: 20 mg/dL (ref 8–23)
CO2: 23 mmol/L (ref 22–32)
Calcium: 9.7 mg/dL (ref 8.9–10.3)
Chloride: 108 mmol/L (ref 98–111)
Creatinine, Ser: 1.25 mg/dL — ABNORMAL HIGH (ref 0.61–1.24)
GFR, Estimated: >60 mL/min (ref >60)
Glucose, Bld: 110 mg/dL — ABNORMAL HIGH (ref 70–99)
Potassium: 4.1 mmol/L (ref 3.5–5.1)
Sodium: 138 mmol/L (ref 135–145)

## 2022-02-15 LAB — LDL CHOLESTEROL, DIRECT: Direct LDL: 82.9 mg/dL (ref 0–99)

## 2022-02-15 LAB — LIPOPROTEIN A (LPA): Lipoprotein (a): 207 nmol/L — ABNORMAL HIGH (ref <75.0)

## 2022-02-15 LAB — CARDIAC CATHETERIZATION: Cath EF Quantitative: 55 %

## 2022-02-15 MED ORDER — ATORVASTATIN CALCIUM 80 MG PO TABS: 80.0000 mg | ORAL_TABLET | Freq: Every day | ORAL | Status: AC

## 2022-02-15 MED ORDER — LISINOPRIL 20 MG PO TABS: 20.0000 mg | ORAL_TABLET | Freq: Every day | ORAL | 0 refills | Status: DC

## 2022-02-15 MED ORDER — METOPROLOL SUCCINATE ER 25 MG PO TB24: 25.0000 mg | ORAL_TABLET | Freq: Every day | ORAL | 0 refills | Status: AC

## 2022-02-15 MED ORDER — NICOTINE 21 MG/24HR TD PT24: 21.0000 mg | MEDICATED_PATCH | Freq: Every day | TRANSDERMAL | 0 refills | Status: AC

## 2022-02-15 MED ORDER — NITROGLYCERIN 0.4 MG SL SUBL: 0.4000 mg | SUBLINGUAL_TABLET | SUBLINGUAL | 12 refills | Status: AC | PRN

## 2022-02-15 MED ORDER — LISINOPRIL-HYDROCHLOROTHIAZIDE 10-12.5 MG PO TABS: 1.0000 | ORAL_TABLET | Freq: Every day | ORAL | 3 refills | Status: AC

## 2022-02-15 NOTE — Progress Notes (Signed)
Called report to [facility]. EMS is here to pick up patient.

## 2022-02-15 NOTE — Discharge Summary (Signed)
Physician Discharge Summary   Patient: Oscar Clark MRN: [id number] DOB: [birth year]  Admit date:     02/12/2022  Discharge date to [facility] 02/15/22  Discharge Physician: Alford Highland   PCP: [name], PA-C   Recommendations at discharge:    Follow up team at [facility] same day  Discharge Diagnoses: Principal Problem:   NSTEMI (non-ST elevated myocardial infarction) [facility]) Active Problems:   Essential hypertension   CKD (chronic kidney disease), stage II   HLD (hyperlipidemia)   History of stroke   Numbness and tingling of right arm   Tobacco abuse   Right groin pain    Hospital Course:  61 y.o. male with medical history significant of hypertension, hyperlipidemia, SVT, tobacco abuse, CKD-2, recent diagnosis of stroke, who presented on 02/12/2022 for evaluation of chest pain, right arm tingling.  Patient was admitted from 5/18 through 01/13/2022 for intermittent chest pain intermittent blurry vision and right arm numbness.  Troponin was minimally elevated at 34, cardiology consulted and patient underwent Myoview stress test with poor tracer uptake, echocardiogram was reassuring.  MRI brain at admission showed a small ischemic infarct in the right medial temporal lobe felt to be incidental per neurology.  CTA of head and neck showed no large vessel occlusions.  He was discharged on aspirin Plavix and Lipitor.  This presentation, patient reported ongoing intermittent chest pain with radiation down the right arm with ongoing right arm numbness and tingling and headache.  Initial troponin in the ED 152, trended upward and peaked at 1398.  EKG showed ST depression in the inferior leads.  Started on heparin infusion with cardiology consulted.  Cardiac cath done on 02/13/22 showing high-grade lesion in the ramus as well as ostial disease LAD and cardiology rec transfer to [facility] for high risk stent versus bypass.  CT cervical spine showed advanced multilevel cervical spondylosis, most significant at  the C5-6 and C6-7 levels, multilevel foraminal stenosis, no high-grade central canal stenosis.  Assessment and Plan: * NSTEMI (non-ST elevated myocardial infarction) (HCC) Trop 152 --> 420 --> peaked at 1398 and down trended from there.  No chest pain or shortness of breath on the day of transfer to [facility].  Continue aspirin, Toprol, Lipitor.  [facility] will decide on high risk stent versus CABG  Essential hypertension -IV hydralazine as needed -Felodipine and Zestoretic, Toprol  CKD (chronic kidney disease), stage II Recently baseline creatinine 1.10 (5/19).  Creatinine 1.29 on admission, Creatinine 1.25 on 6/21  HLD (hyperlipidemia) - Lipitor (LDL 128 ON 5/19) WILL ADD [facility]  History of stroke Recently diagnosed with a small stroke in May. No acute neurologic issues this admission. -Continue aspirin and Lipitor   Numbness and tingling of right arm CT scan of C spine showed advanced multilevel cervical spondylosis, most pronounced at C5-6 and C6-7. No evidence of high-grade canal stenosis by CT. Findings of foraminal stenosis at multiple levels including bilaterally at C4-5 and C5-6, and right-sided at C6-7. -prn percocet and tylenol  -may need to f/u with neurosurgery  Tobacco abuse Counseled regarding importance of cessation.  Right groin pain- consider sonogram right groin once arrived at [facility]          Consultants: Cardiology Procedures performed: cardiac cath Disposition: [facility] Diet recommendation:  Cardiac diet DISCHARGE MEDICATION: Allergies as of 02/15/2022   No Known Allergies      Medication List     STOP taking these medications    acetaminophen 500 MG tablet Commonly known as: TYLENOL  TAKE these medications    aspirin EC 81 MG tablet Take 1 tablet (81 mg total) by mouth daily.   atorvastatin 80 MG tablet Commonly known as: LIPITOR Take 1 tablet (80 mg total) by mouth daily. What changed:  medication strength how  much to take   felodipine 5 MG 24 hr tablet Commonly known as: PLENDIL Take 1 tablet (5 mg total) by mouth daily.   lisinopril-hydrochlorothiazide 10-12.5 MG tablet Commonly known as: ZESTORETIC Take 1 tablet by mouth daily.   metoprolol succinate 25 MG 24 hr tablet Commonly known as: TOPROL-XL Take 1 tablet (25 mg total) by mouth daily.   nicotine 21 mg/24hr patch Commonly known as: NICODERM CQ - dosed in mg/24 hours Place 1 patch (21 mg total) onto the skin daily.   nitroGLYCERIN 0.4 MG SL tablet Commonly known as: NITROSTAT Place 1 tablet (0.4 mg total) under the tongue every 5 (five) minutes as needed for chest pain.        Discharge Exam: Filed Weights   02/12/22 1209 02/13/22 0230 02/13/22 1437  Weight: 80.3 kg 80.5 kg 80.5 kg   Physical Exam HENT:     Head: Normocephalic.     Mouth/Throat:     Pharynx: No oropharyngeal exudate.  Eyes:     General: Lids are normal.     Conjunctiva/sclera: Conjunctivae normal.  Cardiovascular:     Rate and Rhythm: Normal rate and regular rhythm.     Heart sounds: Normal heart sounds, S1 normal and S2 normal.  Pulmonary:     Breath sounds: No decreased breath sounds, wheezing, rhonchi or rales.  Abdominal:     Palpations: Abdomen is soft.     Tenderness: There is no abdominal tenderness.  Musculoskeletal:     Right lower leg: No swelling.     Left lower leg: No swelling.     Comments: Right groin discomfort to palpation  Skin:    General: Skin is warm.     Findings: No rash.  Neurological:     Mental Status: He is alert and oriented to person, place, and time.      Condition at discharge: stable  The results of significant diagnostics from this hospitalization (including imaging, microbiology, ancillary and laboratory) are listed below for reference.   Imaging Studies: PERIPHERAL VASCULAR CATHETERIZATION  Result Date: 02/13/2022 Successful retrieval of a kinked diagnostic cardiac catheter utilizing a snare via the  right common femoral artery to straighten the catheter.   CT HEAD WO CONTRAST (5MM)  Result Date: 02/12/2022 CLINICAL DATA:  Neuro deficit, weakness EXAM: CT HEAD WITHOUT CONTRAST TECHNIQUE: Contiguous axial images were obtained from the base of the skull through the vertex without intravenous contrast. RADIATION DOSE REDUCTION: This exam was performed according to the departmental dose-optimization program which includes automated exposure control, adjustment of the mA and/or kV according to patient size and/or use of iterative reconstruction technique. COMPARISON:  CT head 01/13/2022 FINDINGS: Brain: No acute intracranial hemorrhage, mass effect, or herniation. No extra-axial fluid collections. No evidence of acute territorial infarct. No hydrocephalus. Mild cortical volume loss. Mild patchy hypodensities in the periventricular and subcortical white matter, likely secondary to chronic microvascular ischemic changes. Vascular: No hyperdense vessel or unexpected calcification. Skull: Normal. Negative for fracture or focal lesion. Sinuses/Orbits: No acute finding. Other: None. IMPRESSION: Chronic changes with no acute intracranial process identified. Electronically Signed   By: Ofilia Neas M.D.   On: 02/12/2022 15:59   DG Chest 2 View  Result Date: 02/12/2022 CLINICAL DATA:  Chest pain EXAM: CHEST - 2 VIEW COMPARISON:  Chest x-ray dated Jan 04, 2022 FINDINGS: Cardiac and mediastinal contours are within normal limits. Mild elevation of the left hemidiaphragm. Mild left basilar opacity, likely due to atelectasis. Lungs otherwise clear. No pleural effusion or pneumothorax. IMPRESSION: No acute cardiopulmonary disease. Electronically Signed   By: Yetta Glassman M.D.   On: 02/12/2022 14:40   CT Cervical Spine Wo Contrast  Result Date: 02/12/2022 CLINICAL DATA:  Right-sided cervical radiculopathy EXAM: CT CERVICAL SPINE WITHOUT CONTRAST TECHNIQUE: Multidetector CT imaging of the cervical spine was  performed without intravenous contrast. Multiplanar CT image reconstructions were also generated. RADIATION DOSE REDUCTION: This exam was performed according to the departmental dose-optimization program which includes automated exposure control, adjustment of the mA and/or kV according to patient size and/or use of iterative reconstruction technique. COMPARISON:  01/13/2022 FINDINGS: Alignment: Facet joints are aligned without dislocation or traumatic listhesis. Dens and lateral masses are aligned. Slight reversal of the cervical lordosis. Grade 1 anterolisthesis of C7 on T1. Skull base and vertebrae: No acute fracture. No primary bone lesion or focal pathologic process. Soft tissues and spinal canal: No prevertebral fluid or swelling. No visible canal hematoma. Disc levels: Advanced degenerative disc disease at C5-6 and C6-7 and to a slightly lesser degree at C4-5. Multilevel bilateral facet arthropathy is more pronounced on the left. Findings of foraminal stenosis at multiple levels including bilaterally at C4-5, C5-6, and right-sided at C6-7. No evidence of high-grade canal stenosis by CT. Upper chest: Negative. Other: None. IMPRESSION: 1. No acute osseous abnormality of the cervical spine. 2. Advanced multilevel cervical spondylosis, most pronounced at C5-6 and C6-7. No evidence of high-grade canal stenosis by CT. 3. Findings of foraminal stenosis at multiple levels including bilaterally at C4-5 and C5-6, and right-sided at C6-7. Electronically Signed   By: Davina Poke D.O.   On: 02/12/2022 14:19    Microbiology: Results for orders placed or performed during the hospital encounter of 01/12/22  Resp Panel by RT-PCR (Flu A&B, Covid) Nasopharyngeal Swab     Status: None   Collection Time: 01/12/22  7:44 AM   Specimen: Nasopharyngeal Swab; Nasopharyngeal(NP) swabs in vial transport medium  Result Value Ref Range Status   SARS Coronavirus 2 by RT PCR NEGATIVE NEGATIVE Final    Comment:  (NOTE) SARS-CoV-2 target nucleic acids are NOT DETECTED.  The SARS-CoV-2 RNA is generally detectable in upper respiratory specimens during the acute phase of infection. The lowest concentration of SARS-CoV-2 viral copies this assay can detect is 138 copies/mL. A negative result does not preclude SARS-Cov-2 infection and should not be used as the sole basis for treatment or other patient management decisions. A negative result may occur with  improper specimen collection/handling, submission of specimen other than nasopharyngeal swab, presence of viral mutation(s) within the areas targeted by this assay, and inadequate number of viral copies(<138 copies/mL). A negative result must be combined with clinical observations, patient history, and epidemiological information. The expected result is Negative.  Fact Sheet for Patients:  EntrepreneurPulse.com.au  Fact Sheet for Healthcare Providers:  IncredibleEmployment.be  This test is no t yet approved or cleared by the [place] FDA and  has been authorized for detection and/or diagnosis of SARS-CoV-2 by FDA under an Emergency Use Authorization (EUA). This EUA will remain  in effect (meaning this test can be used) for the duration of the COVID-19 declaration under Section 564(b)(1) of the Act, 21 U.S.C.section 360bbb-3(b)(1), unless the authorization is terminated  or revoked sooner.  Influenza A by PCR NEGATIVE NEGATIVE Final   Influenza B by PCR NEGATIVE NEGATIVE Final    Comment: (NOTE) The Xpert Xpress SARS-CoV-2/FLU/RSV plus assay is intended as an aid in the diagnosis of influenza from Nasopharyngeal swab specimens and should not be used as a sole basis for treatment. Nasal washings and aspirates are unacceptable for Xpert Xpress SARS-CoV-2/FLU/RSV testing.  Fact Sheet for Patients: BloggerCourse.com  Fact Sheet for Healthcare  Providers: SeriousBroker.it  This test is not yet approved or cleared by the [place] FDA and has been authorized for detection and/or diagnosis of SARS-CoV-2 by FDA under an Emergency Use Authorization (EUA). This EUA will remain in effect (meaning this test can be used) for the duration of the COVID-19 declaration under Section 564(b)(1) of the Act, 21 U.S.C. section 360bbb-3(b)(1), unless the authorization is terminated or revoked.  Performed at [facility], [address] [place], [place] [address]     Labs: CBC: Recent Labs  Lab 02/12/22 1211 02/13/22 0503  WBC 5.2 3.8*  NEUTROABS 3.9  --   HGB 16.0 15.5  HCT 48.9 46.8  MCV 84.7 83.9  PLT 294 272   Basic Metabolic Panel: Recent Labs  Lab 02/12/22 1731 02/14/22 0623 02/15/22 0630  NA 138 138 138  K 4.1 4.2 4.1  CL 114* 111 108  CO2 17* 21* 23  GLUCOSE 143* 101* 110*  BUN 31* 22 20  CREATININE 1.29* 1.17 1.25*  CALCIUM 8.8* 9.6 9.7  MG  --  2.0  --    Case discussed with Dr Juliann Pares and nursing staff  Discharge time spent: greater than 30 minutes.   Signed: Alford Highland, MD [facility] 02/15/2022

## 2022-02-16 ENCOUNTER — Other Ambulatory Visit: Payer: Self-pay | Admitting: Family Medicine

## 2022-02-16 DIAGNOSIS — I10 Essential (primary) hypertension: Secondary | ICD-10-CM

## 2022-02-20 ENCOUNTER — Ambulatory Visit: Payer: Self-pay

## 2022-02-20 ENCOUNTER — Encounter: Payer: Self-pay | Admitting: Family Medicine

## 2022-02-20 ENCOUNTER — Ambulatory Visit (INDEPENDENT_AMBULATORY_CARE_PROVIDER_SITE_OTHER): Payer: BC Managed Care – PPO | Admitting: Family Medicine

## 2022-02-20 VITALS — BP 133/92 | HR 92 | Temp 98.6°F | Resp 16 | Ht 69.0 in | Wt 170.0 lb

## 2022-02-20 DIAGNOSIS — M25431 Effusion, right wrist: Secondary | ICD-10-CM | POA: Diagnosis not present

## 2022-02-20 DIAGNOSIS — Z9889 Other specified postprocedural states: Secondary | ICD-10-CM | POA: Insufficient documentation

## 2022-02-22 ENCOUNTER — Encounter: Payer: Self-pay | Admitting: Physician Assistant

## 2022-02-22 ENCOUNTER — Ambulatory Visit (INDEPENDENT_AMBULATORY_CARE_PROVIDER_SITE_OTHER): Payer: BC Managed Care – PPO | Admitting: Physician Assistant

## 2022-02-22 VITALS — BP 124/97 | HR 84 | Temp 98.2°F | Resp 16 | Wt 169.0 lb

## 2022-02-22 DIAGNOSIS — N182 Chronic kidney disease, stage 2 (mild): Secondary | ICD-10-CM | POA: Diagnosis not present

## 2022-02-22 DIAGNOSIS — Z09 Encounter for follow-up examination after completed treatment for conditions other than malignant neoplasm: Secondary | ICD-10-CM

## 2022-02-22 DIAGNOSIS — I214 Non-ST elevation (NSTEMI) myocardial infarction: Secondary | ICD-10-CM | POA: Diagnosis not present

## 2022-02-22 DIAGNOSIS — N529 Male erectile dysfunction, unspecified: Secondary | ICD-10-CM

## 2022-02-22 DIAGNOSIS — I10 Essential (primary) hypertension: Secondary | ICD-10-CM

## 2022-02-22 DIAGNOSIS — R200 Anesthesia of skin: Secondary | ICD-10-CM

## 2022-02-22 DIAGNOSIS — M79601 Pain in right arm: Secondary | ICD-10-CM

## 2022-02-22 MED ORDER — FELODIPINE ER 5 MG PO TB24: 5.0000 mg | ORAL_TABLET | Freq: Every day | ORAL | 0 refills | Status: AC

## 2022-02-22 NOTE — Progress Notes (Unsigned)
I,[name],acting as a Neurosurgeon for [employer], PA-C.,have documented all relevant documentation on the behalf of [name], PA-C,as directed by  [employer], PA-C while in the presence of [employer], PA-C.   Established patient visit   Patient: Oscar Clark   DOB: [birth year]   61 y.o. Male  MRN: [id number] Visit Date: 02/22/2022  Today's healthcare provider: [name], PA-C   Chief Complaint  Patient presents with   Follow-up   Subjective    HPI  Follow up:   The patient was last seen in the office 2 days ago for s/p left heart catheterization by ventricular puncture, and swelling of right wrist. Patient was advised to go to ED at [facility] to allow [place] of TR site to ensure no pseudoaneurysm is present and/or EMG for nerve deficits   He reports poor compliance with treatment. Patient did not go to the ED for evaluation. He feels that condition is  resolved .  Pt was discharged from [facility] on 02/15/22. He was diagnosed with NSTEMI, HTN, CKD, HLD, Hx of stroke, Numbness and tingling of right arm, tobacco abuse and right groin pain. The following plan/assessment was advised: NSTEMI: Continue aspirin, Toprol, Lipitor.  HTN: Felodipine and Zestoretic, Toprol CKD: trending down from 1.29 to 1.25 on 02/15/22 HLD: Lipitor Hx of stroke: a small stroke in May. ASA and Lipitor Numbness and tingling of right arm: tylenol, fu with neurosurgery was advised Tobacco abuse: cessation suggested Right Groin pain- sonogram ??? -----------------------------------------------------------------------------------------   Medications: Outpatient Medications Prior to Visit  Medication Sig   aspirin EC 81 MG tablet Take 1 tablet (81 mg total) by mouth daily.   atorvastatin (LIPITOR) 80 MG tablet Take 1 tablet (80 mg total) by mouth daily.   clopidogrel (PLAVIX) 75 MG tablet Take 75 mg by mouth daily.   lisinopril-hydrochlorothiazide (ZESTORETIC) 10-12.5 MG tablet Take 1  tablet by mouth daily.   metoprolol succinate (TOPROL-XL) 25 MG 24 hr tablet Take 1 tablet (25 mg total) by mouth daily.   nicotine (NICODERM CQ - DOSED IN MG/24 HOURS) 21 mg/24hr patch Place 1 patch (21 mg total) onto the skin daily.   nitroGLYCERIN (NITROSTAT) 0.4 MG SL tablet Place 1 tablet (0.4 mg total) under the tongue every 5 (five) minutes as needed for chest pain.   felodipine (PLENDIL) 5 MG 24 hr tablet TAKE 1 TABLET (5 MG TOTAL) BY MOUTH DAILY. (Patient not taking: Reported on 02/22/2022)   No facility-administered medications prior to visit.    Review of Systems  Constitutional:  Negative for appetite change, chills and fever.  Respiratory:  Negative for chest tightness, shortness of breath and wheezing.   Cardiovascular:  Negative for chest pain and palpitations.  Gastrointestinal:  Negative for abdominal pain, nausea and vomiting.    {Labs  Heme  Chem  Endocrine  Serology  Results Review (optional):23779}   Objective    BP (!) 124/97 (BP Location: Right Arm, Patient Position: Sitting, Cuff Size: Normal)   Pulse 84   Temp 98.2 F (36.8 C) (Oral)   Resp 16   Wt 169 lb (76.7 kg)   SpO2 98% Comment: room air  BMI 24.96 kg/m  {Show previous vital signs (optional):23777}  Physical Exam Vitals reviewed.  Constitutional:      General: He is not in acute distress.    Appearance: Normal appearance. He is well-developed. He is not diaphoretic.  HENT:     Head: Normocephalic and atraumatic.  Right Ear: Tympanic membrane, ear canal and external ear normal.     Left Ear: Tympanic membrane, ear canal and external ear normal.     Nose: Nose normal.     Mouth/Throat:     Mouth: Mucous membranes are moist.     Pharynx: Oropharynx is clear. No oropharyngeal exudate.  Eyes:     General: No scleral icterus.    Conjunctiva/sclera: Conjunctivae normal.     Pupils: Pupils are equal, round, and reactive to light.  Neck:     Thyroid: No thyromegaly.  Cardiovascular:      Rate and Rhythm: Normal rate and regular rhythm.     Pulses: Normal pulses.     Heart sounds: Normal heart sounds. No murmur heard. Pulmonary:     Effort: Pulmonary effort is normal. No respiratory distress.     Breath sounds: Normal breath sounds. No wheezing or rales.  Abdominal:     General: There is no distension.     Palpations: Abdomen is soft.     Tenderness: There is no abdominal tenderness.  Musculoskeletal:        General: No deformity.     Cervical back: Neck supple.     Right lower leg: No edema.     Left lower leg: No edema.  Lymphadenopathy:     Cervical: No cervical adenopathy.  Skin:    General: Skin is warm and dry.     Findings: No rash.  Neurological:     Mental Status: He is alert and oriented to person, place, and time. Mental status is at baseline.     Sensory: No sensory deficit.     Motor: No weakness.     Gait: Gait normal.  Psychiatric:        Mood and Affect: Mood normal.        Behavior: Behavior normal.        Thought Content: Thought content normal.     No results found for any visits on 02/22/22.  Assessment & Plan      Primary hypertension Pt ran out of the BP medication. Medication was refilled. - felodipine (PLENDIL) 5 MG 24 hr tablet; Take 1 tablet (5 mg total) by mouth daily.  Dispense: 30 tablet; Refill: 0 He does not have refill for other BP medications  NSTEMI (non-ST elevated myocardial infarction) (HCC) P stent/Everdimus eluting cor stent Will referred to Cardiology. Dr. Gerald Leitz  Numbness and tingling of right arm Per chart review, CT scan of C spine showed advanced multilevel cervical spondylosis, most pronounced at C5-6 and C6-7. No evidence of high-grade canal stenosis by CT. Findings of foraminal stenosis at multiple levels including bilaterally at C4-5 and C5-6, and right-sided at C6-7. Continue tylenol  Might need to f/u with neurosurgery   Erectile dysfunction, unspecified erectile dysfunction type Pt requested  Cialis. We discussed that he might need to wait for extensive activities till some times later   CKD (chronic kidney disease), stage II Last labs form 02/16/22 showed Cr 1.25 We will monitor Hospital Course:  Oscar Clark is a 61 y.o. male with PMH of CAD, HLD, HTN, CKD2, and recent CVA (5/23) who presented from OSH with NSTEMI and findings of high-grade lesion in ramus. He was transferred here for PCI. Please see below for a summary of his hospital course by problem:   NSTEMI s/p PCI with DESx1 to RI  CAD  HLD  Hx CVA He initially presented to OSH with R arm pain/numbness, chest pain, headache, and blurry  vision. Was found to have elevated trop which peaked at 1.3k. EKG at OSH showed ST depressions in inferior leads. He was placed on a heparin gtt. He was transferred from OSH due to high grade lesion in ramus and need for PCI. Now s/p LHC with PCI with DESx1 to ramus intermedius. He was loaded with aspirin & prasugrel. D/t history of prior stroke (prior infarct seen on MRI at OSH), discharged on daily plavix. Will need to be on DAPT with plavix and aspirin for at least 1 year. He had an echo in 12/2021 which showed LVEF 55-60%; could consider repeat outpatient.  [ ]  Continue daily aspirin & plavix for at least one year [ ]  Continue metoprolol succinate 25mg  daily  [ ]  Continue atorvastatin 80mg  daily   CKD2 Hx of CKD2 noted in his chart, on review of labs in Care Everywhere, baseline sCr likely between 1.0-1.1. Mildly elevated here at 1.25.  [ ]  BMP outpatient to follow-up   HTN BP poorly controlled. Reported having symptoms at home which are c/f uncontrolled HTN (headaches, blurry vision). Denied symptoms here.  [ ]  Increase to lisinopril-hydrochlorothiazide 20-12.5mg  daily  [ ]  Continue home felodipine 5mg  daily  [ ]  Continue metoprolol succinate 25mg  daily   Tobacco Use: Encouraged smoking cessation. Tobacco cessation consult placed. Discharged with nicotine patches, nicotine gum and  lozenge.  T2DM: A1c in 12/2021 was 6.3%. Did not require SSI.  The patient's hospital stay has been complicated by the following clinically significant conditions requiring additional evaluation and treatment or having a significant effect of this patient's care: - Chronic kidney disease POA requiring further investigation, treatment, or monitoring   Nutrition Assessment:  Patient does not meet AND/ASPEN criteria for malnutrition at this time (02/16/22 1041)   Outpatient Provider Follow Up Issues:  [ ]  Consider repeat echo (normal echo in 12/2021) [ ]  Continue DAPT for at least 1 year   Touchbase with Outpatient Provider: Warm Handoff: Completed on 02/16/22 by Hoyt Koch (Intern) via In-Basket Message  Procedures: LHC with PCI Smoking cessation Recommended. Nicotine patch, gum, lozenge at [facility] on discharge./02/15/22 The patient was advised to call back or seek an in-person evaluation if the symptoms worsen or if the condition fails to improve as anticipated.  I discussed the assessment and treatment plan with the patient. The patient was provided an opportunity to ask questions and all were answered. The patient agreed with the plan and demonstrated an understanding of the instructions.  The entirety of the information documented in the History of Present Illness, Review of Systems and Physical Exam were personally obtained by me. Portions of this information were initially documented by the CMA and reviewed by me for thoroughness and accuracy.  Portions of this note were created using dictation software and may contain typographical errors.     Total encounter time more than 30 minutes Greater than 50% was spent in counseling and coordination of care with the patient  Elberta Leatherwood  [facility] [phone] (phone) [phone] (fax)  [place]

## 2022-02-23 NOTE — Telephone Encounter (Signed)
Patient was seen by Oscar Clark once as a hospital f/u, not to establish new PCP after [facility] left.  Previously Oscar Clark has stated that he will accept family members, but up to their discretion I guess.  They do have the option of one switch, so Oscar Clark would be ok if not approved by the other 2.

## 2022-02-24 NOTE — Progress Notes (Incomplete)
I,[name],acting as a Neurosurgeon for [employer], PA-C.,have documented all relevant documentation on the behalf of [name], PA-C,as directed by  [employer], PA-C while in the presence of [employer], PA-C.   Established patient visit   Patient: Oscar Clark   DOB: June 11, 1961   61 y.o. Male  MRN: [id number] Visit Date: 02/22/2022  Today's healthcare provider: [name], PA-C   Chief Complaint  Patient presents with  . Follow-up   Subjective    HPI  Follow up:   The patient was last seen in the office 2 days ago for s/p left heart catheterization by ventricular puncture, and swelling of right wrist. Patient was advised to go to ED at [facility] to allow [place] of TR site to ensure no pseudoaneurysm is present and/or EMG for nerve deficits   He reports poor compliance with treatment. Patient did not go to the ED for evaluation. He feels that condition is  resolved .  Pt was discharged from [facility] on 02/15/22. He was diagnosed with NSTEMI, HTN, CKD, HLD, Hx of stroke, Numbness and tingling of right arm, tobacco abuse and right groin pain. The following plan/assessment was advised: NSTEMI: Continue aspirin, Toprol, Lipitor.  HTN: Felodipine and Zestoretic, Toprol CKD: trending down from 1.29 to 1.25 on 02/15/22 HLD: Lipitor Hx of stroke: a small stroke in May. ASA and Lipitor Numbness and tingling of right arm: tylenol, fu with neurosurgery was advised Tobacco abuse: cessation suggested Right Groin pain- sonogram ??? -----------------------------------------------------------------------------------------   Medications: Outpatient Medications Prior to Visit  Medication Sig  . aspirin EC 81 MG tablet Take 1 tablet (81 mg total) by mouth daily.  Marland Kitchen atorvastatin (LIPITOR) 80 MG tablet Take 1 tablet (80 mg total) by mouth daily.  . clopidogrel (PLAVIX) 75 MG tablet Take 75 mg by mouth daily.  Marland Kitchen lisinopril-hydrochlorothiazide (ZESTORETIC) 10-12.5 MG tablet  Take 1 tablet by mouth daily.  . metoprolol succinate (TOPROL-XL) 25 MG 24 hr tablet Take 1 tablet (25 mg total) by mouth daily.  . nicotine (NICODERM CQ - DOSED IN MG/24 HOURS) 21 mg/24hr patch Place 1 patch (21 mg total) onto the skin daily.  . nitroGLYCERIN (NITROSTAT) 0.4 MG SL tablet Place 1 tablet (0.4 mg total) under the tongue every 5 (five) minutes as needed for chest pain.  . felodipine (PLENDIL) 5 MG 24 hr tablet TAKE 1 TABLET (5 MG TOTAL) BY MOUTH DAILY. (Patient not taking: Reported on 02/22/2022)   No facility-administered medications prior to visit.    Review of Systems  Constitutional:  Negative for appetite change, chills and fever.  Respiratory:  Negative for chest tightness, shortness of breath and wheezing.   Cardiovascular:  Negative for chest pain and palpitations.  Gastrointestinal:  Negative for abdominal pain, nausea and vomiting.    {Labs  Heme  Chem  Endocrine  Serology  Results Review (optional):23779}   Objective    BP (!) 124/97 (BP Location: Right Arm, Patient Position: Sitting, Cuff Size: Normal)   Pulse 84   Temp 98.2 F (36.8 C) (Oral)   Resp 16   Wt 169 lb (76.7 kg)   SpO2 98% Comment: room air  BMI 24.96 kg/m  {Show previous vital signs (optional):23777}  Physical Exam Vitals reviewed.  Constitutional:      General: He is not in acute distress.    Appearance: Normal appearance. He is well-developed. He is not diaphoretic.  HENT:     Head: Normocephalic and atraumatic.  Right Ear: Tympanic membrane, ear canal and external ear normal.     Left Ear: Tympanic membrane, ear canal and external ear normal.     Nose: Nose normal.     Mouth/Throat:     Mouth: Mucous membranes are moist.     Pharynx: Oropharynx is clear. No oropharyngeal exudate.  Eyes:     General: No scleral icterus.    Conjunctiva/sclera: Conjunctivae normal.     Pupils: Pupils are equal, round, and reactive to light.  Neck:     Thyroid: No thyromegaly.   Cardiovascular:     Rate and Rhythm: Normal rate and regular rhythm.     Pulses: Normal pulses.     Heart sounds: Normal heart sounds. No murmur heard. Pulmonary:     Effort: Pulmonary effort is normal. No respiratory distress.     Breath sounds: Normal breath sounds. No wheezing or rales.  Abdominal:     General: There is no distension.     Palpations: Abdomen is soft.     Tenderness: There is no abdominal tenderness.  Musculoskeletal:        General: No deformity.     Cervical back: Neck supple.     Right lower leg: No edema.     Left lower leg: No edema.  Lymphadenopathy:     Cervical: No cervical adenopathy.  Skin:    General: Skin is warm and dry.     Findings: No rash.  Neurological:     Mental Status: He is alert and oriented to person, place, and time. Mental status is at baseline.     Sensory: No sensory deficit.     Motor: No weakness.     Gait: Gait normal.  Psychiatric:        Mood and Affect: Mood normal.        Behavior: Behavior normal.        Thought Content: Thought content normal.     No results found for any visits on 02/22/22.  Assessment & Plan      Primary hypertension BP today was 124/97 Asymptomatic. [ ]  Continue lisinopril-hydrochlorothiazide 20-12.5mg  daily  [ ]  Continue home felodipine 5mg  daily  [ ]  Continue metoprolol succinate 25mg  daily  Pt ran out of the BP medication. Medication was refilled. - felodipine (PLENDIL) 5 MG 24 hr tablet; Take 1 tablet (5 mg total) by mouth daily.  Dispense: 30 tablet; Refill: 0 He does not need refill for other BP medications  NSTEMI (non-ST elevated myocardial infarction) [facility]) Now s/p LHC with PCI with DESx1 to ramus intermedius.  He had an echo in 12/2021 which showed LVEF 55-60%; could consider repeat outpatient/per cardiology discretion  [ ]  Continue daily aspirin & plavix for at least one year [ ]  Continue metoprolol succinate 25mg  daily  [ ]  Continue atorvastatin 80mg  daily  [ ]  Consider repeat  echo (normal echo in 12/2021)   Will referred to [facility].  Numbness and tingling of right arm Per chart review, CT scan of C spine showed advanced multilevel cervical spondylosis, most pronounced at C5-6 and C6-7. No evidence of high-grade canal stenosis by CT. Findings of foraminal stenosis at multiple levels including bilaterally at C4-5 and C5-6, and right-sided at C6-7. Continue tylenol  Might need to f/u with neurosurgery   Erectile dysfunction, unspecified erectile dysfunction type Pt requested Cialis. We discussed that he might need to wait for extensive activities till some times later   CKD (chronic kidney disease), stage II Last labs form 02/16/22  showed Cr 1.25 Hx of CKD2 noted in his chart, on review of labs in Care Everywhere, baseline sCr likely between 1.0-1.1. Mildly elevated here at 1.25.  [ ]  BMP outpatient to follow-up   Tobacco use  Encouraged smoking cessation. Tobacco cessation consult placed. Discharged with nicotine patches, nicotine gum and lozenge.   The patient was advised to call back or seek an in-person evaluation if the symptoms worsen or if the condition fails to improve as anticipated.  I discussed the assessment and treatment plan with the patient. The patient was provided an opportunity to ask questions and all were answered. The patient agreed with the plan and demonstrated an understanding of the instructions.  The entirety of the information documented in the History of Present Illness, Review of Systems and Physical Exam were personally obtained by me. Portions of this information were initially documented by the CMA and reviewed by me for thoroughness and accuracy.  Portions of this note were created using dictation software and may contain typographical errors.     Total encounter time more than 30 minutes Greater than 50% was spent in counseling and coordination of care with the patient   [facility] [phone] (phone) [phone] (fax)  [facility]

## 2022-03-01 NOTE — Consult Note (Signed)
CARDIOLOGY CONSULT NOTE               Patient ID: Oscar Clark MRN: [id number] DOB/AGE: 1961/05/17 61 y.o.  Admit date: 02/12/2022 Referring Physician Dr Lorretta Harp hospitalist Primary Physician Madelon Lips, PA-C Primary Cardiologist  Reason for Consultation unstable angina possible non-STEMI  HPI: 61 year old male history of multiple medical problems hypertension hyperlipidemia previous SVT smoking renal insufficiency recently had a small CVA presents to the emergency room with chest pain right arm tingling numbness atypical chest pain symptoms patient was hospitalized about a month ago for similar anginal type symptoms patient had a negative cardiac markers troponins EKGs were nondiagnostic echocardiogram showed preserved left ventricular function Myoview failed to show any evidence of significant ischemia patient was found to have incidental finding for CVA by MRI was treated medically with statin Plavix aspirin reportedly for 3 weeks patient now presents for further cardiac assessment chest pain l right arm discomfort  Review of systems complete and found to be negative unless listed above     Past Medical History:  Diagnosis Date   Hypertension     Past Surgical History:  Procedure Laterality Date   FOREIGN BODY RETRIEVAL N/A 02/13/2022   Procedure: FOREIGN BODY RETRIEVAL;  Surgeon: Iran Ouch, MD;  Location: [facility] INVASIVE CV [facility];  Service: Cardiovascular;  Laterality: N/A;   KIDNEY STONE SURGERY     LEFT HEART CATH AND CORONARY ANGIOGRAPHY N/A 02/13/2022   Procedure: LEFT HEART CATH AND CORONARY ANGIOGRAPHY with possible PCI and Stent;  Surgeon: Alwyn Pea, MD;  Location: [facility] INVASIVE CV [facility];  Service: Cardiovascular;  Laterality: N/A;    No medications prior to admission.   Social History   Socioeconomic History   Marital status: Single    Spouse name: Not on file   Number of children: Not on file   Years of education: Not on file   Highest education  level: Not on file  Occupational History   Not on file  Tobacco Use   Smoking status: Former    Packs/day: 0.50    Years: 18.00    Total pack years: 9.00    Types: Cigarettes   Smokeless tobacco: Never  Substance and Sexual Activity   Alcohol use: Yes    Alcohol/week: 0.0 standard drinks of alcohol    Comment: OCCASIONALLY   Drug use: No   Sexual activity: Yes  Other Topics Concern   Not on file  Social History Narrative   Not on file   Social Determinants of Health   Financial Resource Strain: Not on file  Food Insecurity: Not on file  Transportation Needs: Not on file  Physical Activity: Not on file  Stress: Not on file  Social Connections: Not on file  Intimate Partner Violence: Not on file    Family History  Problem Relation Age of Onset   Hypertension Mother    Hypertension Brother       Review of systems complete and found to be negative unless listed above      PHYSICAL EXAM  General: Well developed, well nourished, in no acute distress HEENT:  Normocephalic and atramatic Neck:  No JVD.  Lungs: Clear bilaterally to auscultation and percussion. Heart: HRRR . Normal S1 and S2 without gallops or murmurs.  Abdomen: Bowel sounds are positive, abdomen soft and non-tender  Msk:  Back normal, normal gait. Normal strength and tone for age. Extremities: No clubbing, cyanosis or edema.   Neuro: Alert and oriented X 3. Psych:  Peri [place]  affect, responds appropriately  Labs:   Lab Results  Component Value Date   WBC 3.8 (L) 02/13/2022   HGB 15.5 02/13/2022   HCT 46.8 02/13/2022   MCV 83.9 02/13/2022   PLT 272 02/13/2022   No results for input(s): "NA", "K", "CL", "CO2", "BUN", "CREATININE", "CALCIUM", "PROT", "BILITOT", "ALKPHOS", "ALT", "AST", "GLUCOSE" in the last 168 hours.  Invalid input(s): "LABALBU" Lab Results  Component Value Date   CKTOTAL 175 01/12/2022    Lab Results  Component Value Date   CHOL 228 (H) 01/13/2022   CHOL 166 03/16/2019    CHOL 202 (H) 06/11/2015   Lab Results  Component Value Date   HDL 38 (L) 01/13/2022   HDL 43 03/16/2019   HDL 40 06/11/2015   Lab Results  Component Value Date   LDLCALC 128 (H) 01/13/2022   LDLCALC 112 (H) 03/16/2019   LDLCALC 121 (H) 06/11/2015   Lab Results  Component Value Date   TRIG 310 (H) 01/13/2022   TRIG 53 03/16/2019   TRIG 206 (H) 06/11/2015   Lab Results  Component Value Date   CHOLHDL 6.0 01/13/2022   CHOLHDL 3.9 03/16/2019   CHOLHDL 5.1 (H) 06/11/2015   Lab Results  Component Value Date   LDLDIRECT 82.9 02/15/2022      Radiology: CARDIAC CATHETERIZATION  Result Date: 02/15/2022   Ramus-1 lesion is 75% stenosed.   Ramus-2 lesion is 90% stenosed.   Ost LAD to Prox LAD lesion is 50% stenosed.   Ost RCA to Dist RCA lesion is 75% stenosed with 75% stenosed side branch in RPDA.   The left ventricular systolic function is normal.   LV end diastolic pressure is normal.   The left ventricular ejection fraction is 55-65% by visual estimate. Conclusion Successful left heart cath because of non-STEMI Left ventricular function was normal at 55 to 60% Coronaries Left main minor distal disease otherwise minor irregularities ID was large with what appeared to be 50% ostial lesion otherwise mild irregular irregularities throughout small vessel Circumflex was large with minor irregularities Ramus is relatively large with a 75% ostial lesion and a 90% mid body All vessels had TIMI-3 flow RCA was medium in size mixed dominant Diffuse 75% throughout the vessel Patient have noted had severe tortuosity of the right subclavian area which made radial access impossible to access the coronary arteries Case was completed from the right femoral area Patient tolerated procedure well Right coronary artery through the right radial aorta became kinked because of severe tortuosity requiring [place] to snare proximal and to straighten the catheter out and then advance a wire through the catheter in order to  remove it.  This was done with Dr. Tedra [place] help and procedure was successful and patient tolerated procedure well no residual issues with this arm Arrangements were made to have the patient transferred to [facility] for further complex intervention of the ramus versus coronary bypass surgery   PERIPHERAL VASCULAR CATHETERIZATION  Result Date: 02/13/2022 Successful retrieval of a kinked diagnostic cardiac catheter utilizing a snare via the right common femoral artery to straighten the catheter.   CT HEAD WO CONTRAST (5MM)  Result Date: 02/12/2022 CLINICAL DATA:  Neuro deficit, weakness EXAM: CT HEAD WITHOUT CONTRAST TECHNIQUE: Contiguous axial images were obtained from the base of the skull through the vertex without intravenous contrast. RADIATION DOSE REDUCTION: This exam was performed according to the departmental dose-optimization program which includes automated exposure control, adjustment of the mA and/or kV according to patient size and/or use of iterative  reconstruction technique. COMPARISON:  CT head 01/13/2022 FINDINGS: Brain: No acute intracranial hemorrhage, mass effect, or herniation. No extra-axial fluid collections. No evidence of acute territorial infarct. No hydrocephalus. Mild cortical volume loss. Mild patchy hypodensities in the periventricular and subcortical white matter, likely secondary to chronic microvascular ischemic changes. Vascular: No hyperdense vessel or unexpected calcification. Skull: Normal. Negative for fracture or focal lesion. Sinuses/Orbits: No acute finding. Other: None. IMPRESSION: Chronic changes with no acute intracranial process identified. Electronically Signed   By: Jannifer Hick M.D.   On: 02/12/2022 15:59   DG Chest 2 View  Result Date: 02/12/2022 CLINICAL DATA:  Chest pain EXAM: CHEST - 2 VIEW COMPARISON:  Chest x-ray dated Jan 04, 2022 FINDINGS: Cardiac and mediastinal contours are within normal limits. Mild elevation of the left hemidiaphragm. Mild left  basilar opacity, likely due to atelectasis. Lungs otherwise clear. No pleural effusion or pneumothorax. IMPRESSION: No acute cardiopulmonary disease. Electronically Signed   By: Allegra Lai M.D.   On: 02/12/2022 14:40   CT Cervical Spine Wo Contrast  Result Date: 02/12/2022 CLINICAL DATA:  Right-sided cervical radiculopathy EXAM: CT CERVICAL SPINE WITHOUT CONTRAST TECHNIQUE: Multidetector CT imaging of the cervical spine was performed without intravenous contrast. Multiplanar CT image reconstructions were also generated. RADIATION DOSE REDUCTION: This exam was performed according to the departmental dose-optimization program which includes automated exposure control, adjustment of the mA and/or kV according to patient size and/or use of iterative reconstruction technique. COMPARISON:  01/13/2022 FINDINGS: Alignment: Facet joints are aligned without dislocation or traumatic listhesis. Dens and lateral masses are aligned. Slight reversal of the cervical lordosis. Grade 1 anterolisthesis of C7 on T1. Skull base and vertebrae: No acute fracture. No primary bone lesion or focal pathologic process. Soft tissues and spinal canal: No prevertebral fluid or swelling. No visible canal hematoma. Disc levels: Advanced degenerative disc disease at C5-6 and C6-7 and to a slightly lesser degree at C4-5. Multilevel bilateral facet arthropathy is more pronounced on the left. Findings of foraminal stenosis at multiple levels including bilaterally at C4-5, C5-6, and right-sided at C6-7. No evidence of high-grade canal stenosis by CT. Upper chest: Negative. Other: None. IMPRESSION: 1. No acute osseous abnormality of the cervical spine. 2. Advanced multilevel cervical spondylosis, most pronounced at C5-6 and C6-7. No evidence of high-grade canal stenosis by CT. 3. Findings of foraminal stenosis at multiple levels including bilaterally at C4-5 and C5-6, and right-sided at C6-7. Electronically Signed   By: Duanne Guess D.O.    On: 02/12/2022 14:19    EKG: Normal sinus rhythm rate of 80 right bundle branch block nonspecific ST-T wave changes  ECHO 12/2021 Recent echocardiogram with preserved left ventricular function 55 to 60%  ASSESSMENT AND PLAN:  Non-STEMI Unstable angina SVT Hypertension Hyperlipidemia Regular sufficiency stage II History of recent CVA Tobacco abuse . Plan Agree with admit to telemetry rule out myocardial infarction follow-up EKGs Heparin for anticoagulation Hypertension controlled felodipine Zestoretic consider adding a beta-blocker Continue lipid management with Lipitor Low-dose aspirin therapy recent CVA possible acute coronary syndrome Continue nephrology input for renal insufficiency maintain adequate hydration Would recommend proceeding with diagnostic cardiac cath prior to discharge Signed: Alwyn Pea MD 03/01/2022, 8:05 AM

## 2022-03-06 ENCOUNTER — Telehealth: Payer: Self-pay | Admitting: Family Medicine

## 2022-03-06 NOTE — Telephone Encounter (Signed)
French Ana with [employer] [facility] called in to make provider aware that pt passed away yesterday Apr 05, 2023. They will be sending over pt's death certificate.

## 2022-03-07 NOTE — Telephone Encounter (Signed)
I can's sign without approximate time of death. Please check with funeral home

## 2022-03-07 NOTE — Telephone Encounter (Signed)
Per French Ana with [facility] called them about 10 am on 2022/12/10 to pick up the body.French Ana states that they were told that patient's mother found him dead that December 10, 2022 morning and the last time the mother spoke to him was Saturday night.Report states that patient had been dead for more than 24 hours when they found him.

## 2022-03-16 ENCOUNTER — Other Ambulatory Visit: Payer: Self-pay | Admitting: Family Medicine

## 2022-03-16 DIAGNOSIS — I10 Essential (primary) hypertension: Secondary | ICD-10-CM

## 2022-03-24 ENCOUNTER — Other Ambulatory Visit: Payer: Self-pay | Admitting: Physician Assistant

## 2022-03-24 DIAGNOSIS — I10 Essential (primary) hypertension: Secondary | ICD-10-CM

## 2022-03-28 DEATH — deceased

## 2022-04-03 DIAGNOSIS — N182 Chronic kidney disease, stage 2 (mild): Secondary | ICD-10-CM | POA: Diagnosis not present

## 2022-04-04 LAB — BASIC METABOLIC PANEL
BUN/Creatinine Ratio: 15 (ref 10–24)
BUN: 20 mg/dL (ref 8–27)
CO2: 26 mmol/L (ref 20–29)
Calcium: 9.8 mg/dL (ref 8.6–10.2)
Chloride: 99 mmol/L (ref 96–106)
Creatinine, Ser: 1.37 mg/dL — ABNORMAL HIGH (ref 0.76–1.27)
Glucose: 83 mg/dL (ref 70–99)
Potassium: 5.7 mmol/L — ABNORMAL HIGH (ref 3.5–5.2)
Sodium: 139 mmol/L (ref 134–144)
eGFR: 59 mL/min/{1.73_m2} — ABNORMAL LOW (ref >59)

## 2022-05-05 ENCOUNTER — Ambulatory Visit: Payer: BC Managed Care – PPO | Admitting: Physician Assistant

## 2023-04-15 IMAGING — CT CT HEAD W/O CM
4 series · 17 of 47 positions shown, 19 images · non-contrast
Comparison: CT head 01/13/2022

CLINICAL DATA: Neuro deficit, weakness



[Series 2: head wo · axial · 0.46mm/px · z∈[-94,+36]mm · 7 of 36 slices shown, 9 images]
[im 5/36  brain]
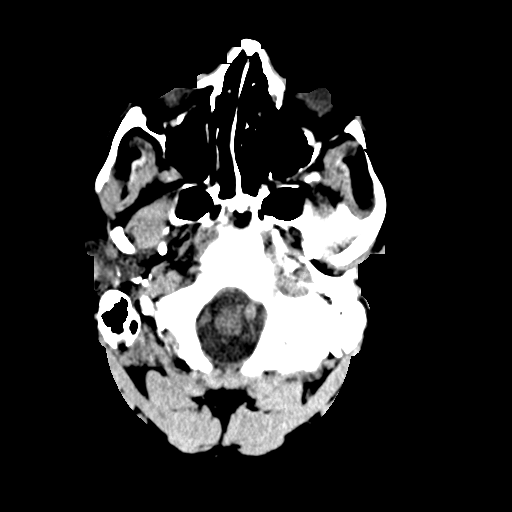
[im 5/36  bone]
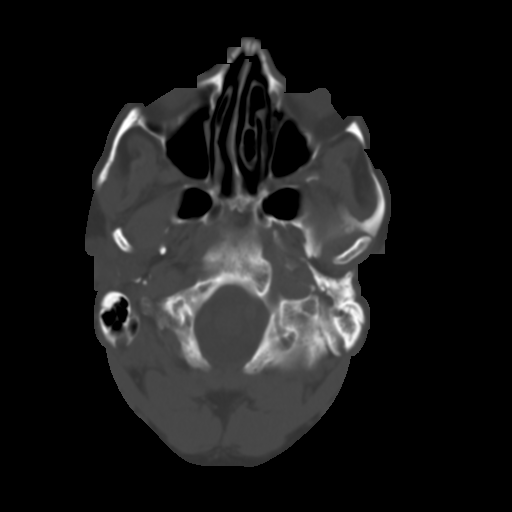
[im 9/36  brain]
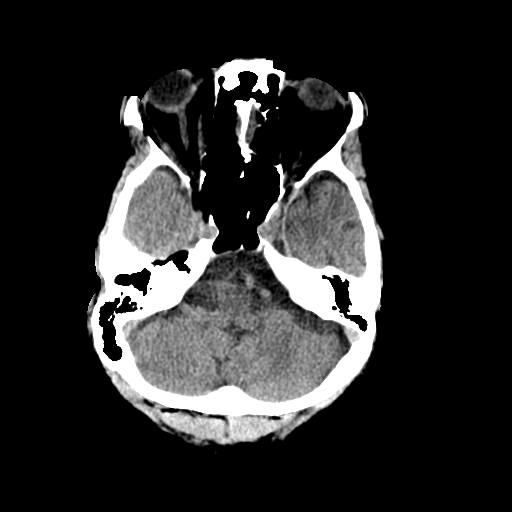
[im 14/36  brain]
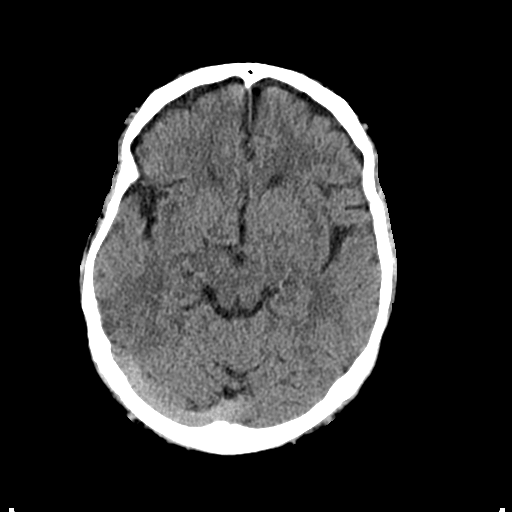
[im 18/36  brain]
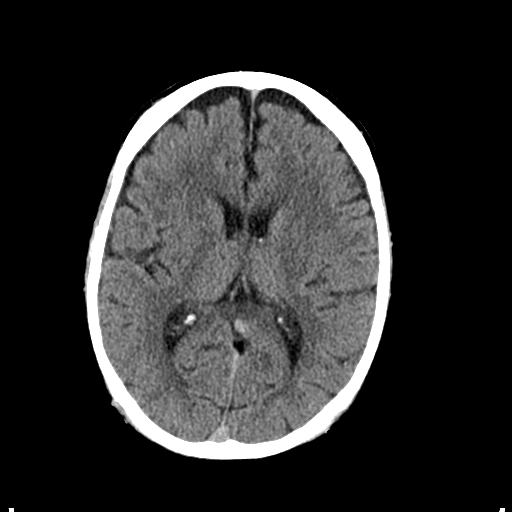
[im 22/36  brain]
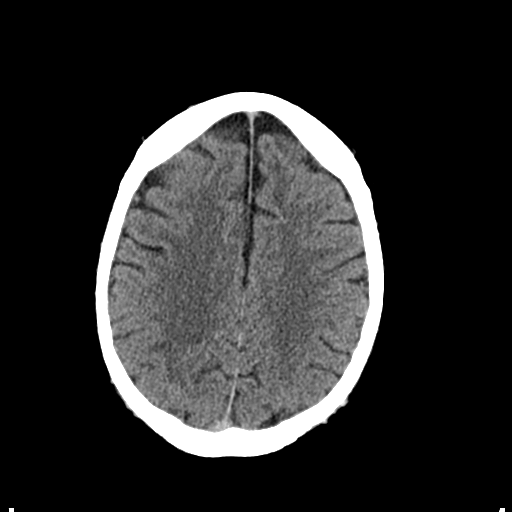
[im 22/36  bone]
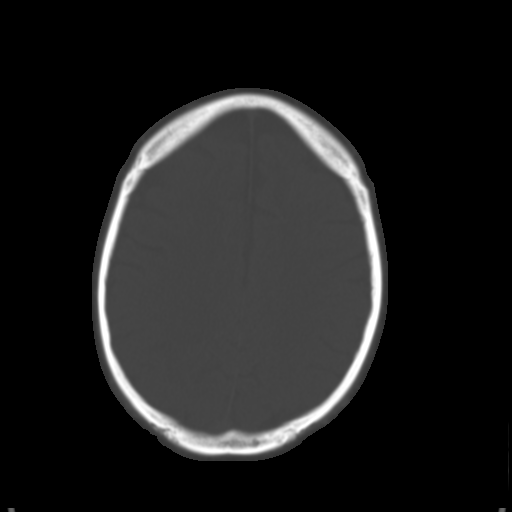
[im 27/36  brain]
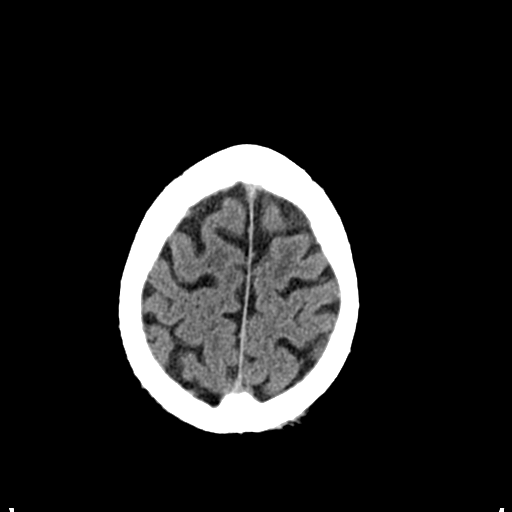
[im 31/36  brain]
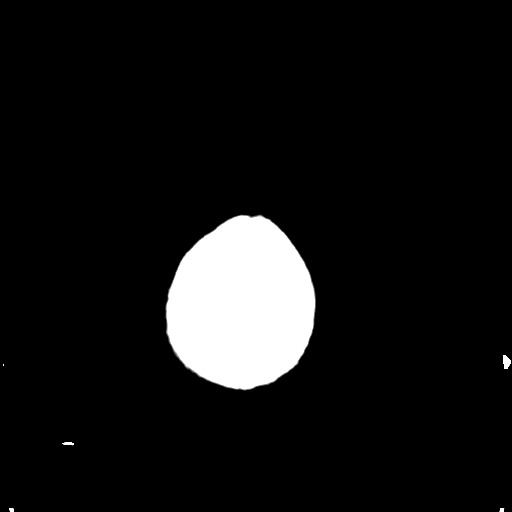

[Series 3: head bone · axial · 0.46mm/px · z∈[-98,-34]mm · 4 of 90 slices shown]
[im 9/90  bone]
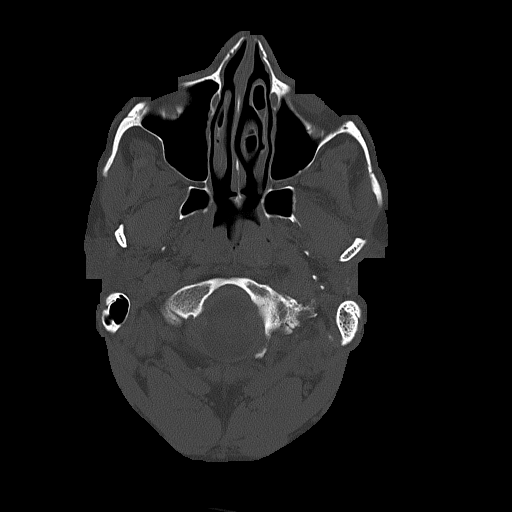
[im 18/90  bone]
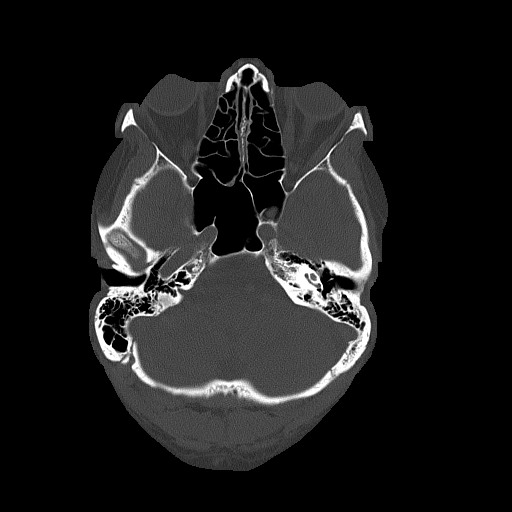
[im 27/90  bone]
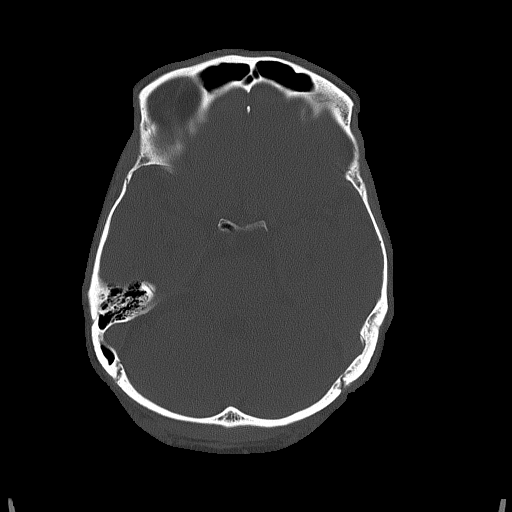
[im 41/90  bone]
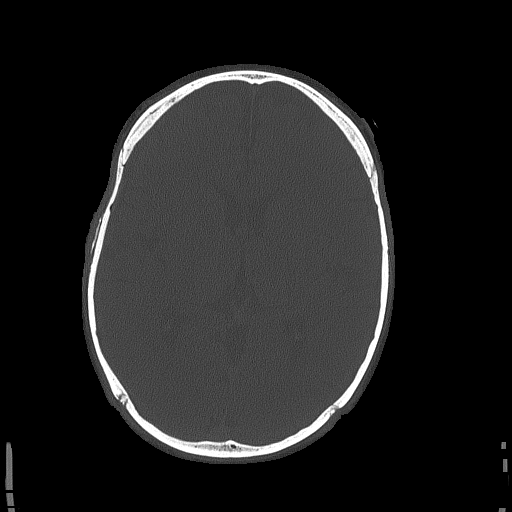

[Series 4: coronal soft tissue · coronal · 0.36mm/px · 3 of 74 slices shown]
[im 25/74  brain]
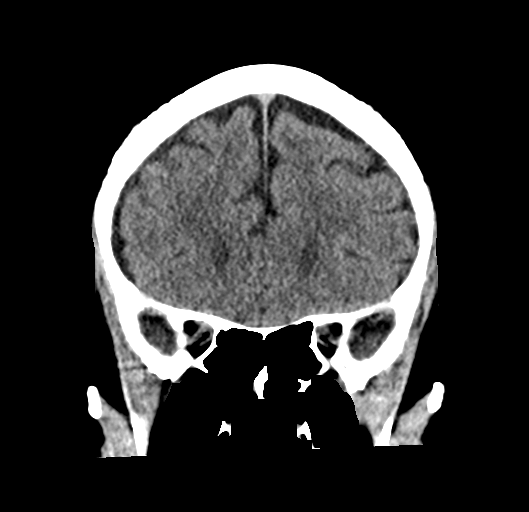
[im 33/74  brain]
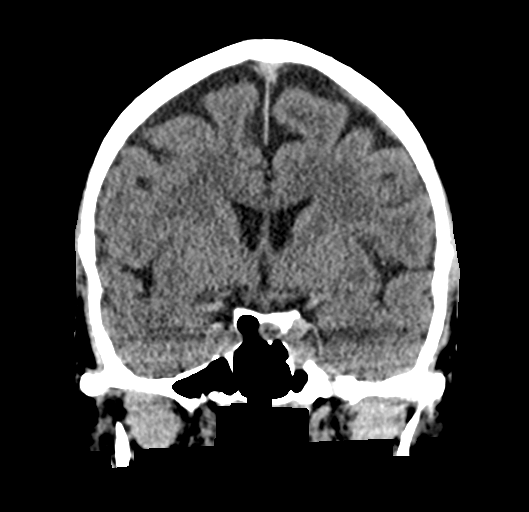
[im 41/74  brain]
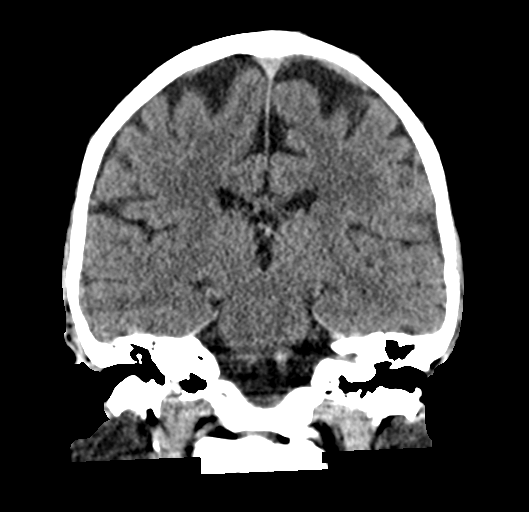

[Series 5: sagittal soft tissue · sagittal · 0.37mm/px · 3 of 58 slices shown]
[im 20/58  brain]
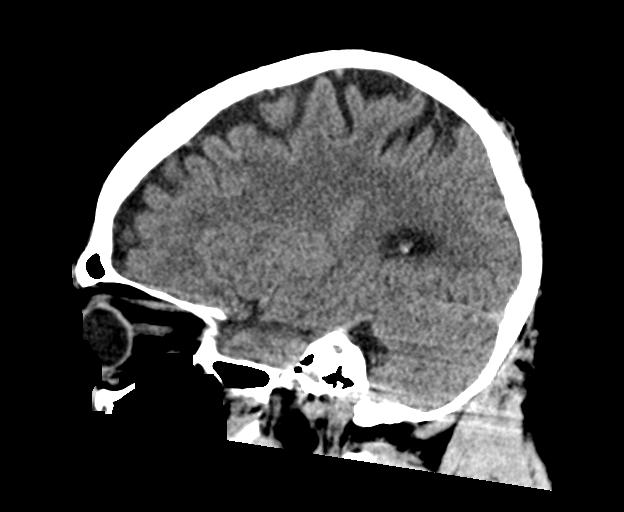
[im 29/58  brain]
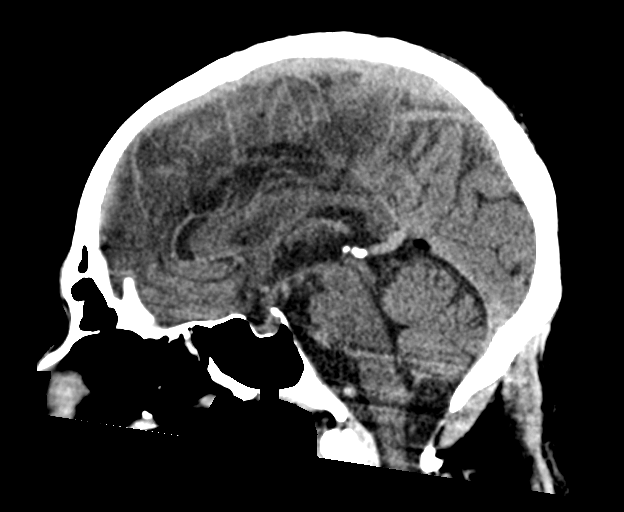
[im 39/58  brain]
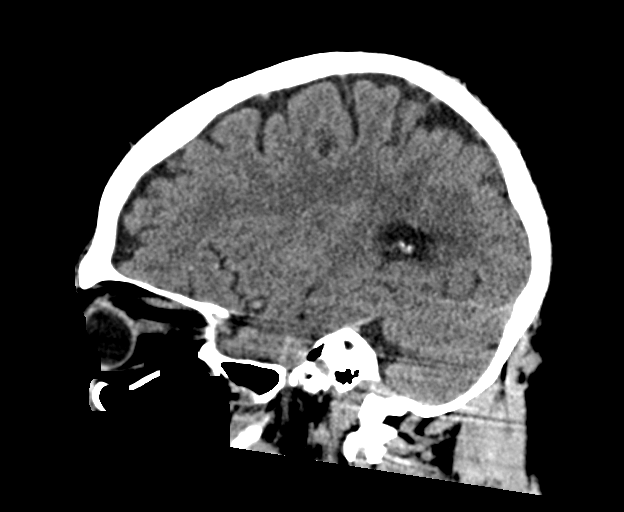

[17 of 47 positions shown; findings below may reference images not displayed]

FINDINGS: Brain: No acute intracranial hemorrhage, mass effect, or herniation.
No extra-axial fluid collections. No evidence of acute territorial
infarct. No hydrocephalus. Mild cortical volume loss. Mild patchy
hypodensities in the periventricular and subcortical white matter,
likely secondary to chronic microvascular ischemic changes.

Vascular: No hyperdense vessel or unexpected calcification.

Skull: Normal. Negative for fracture or focal lesion.

Sinuses/Orbits: No acute finding.

Other: None.
IMPRESSION: Chronic changes with no acute intracranial process identified.
